# Patient Record
Sex: Female | Born: 1995 | Race: Black or African American | Hispanic: No | Marital: Single | Smoking: Never smoker
Health system: Southern US, Community
[De-identification: ages and names within clinical notes are randomized; demographics above are authoritative.]

## PROBLEM LIST (undated history)

## (undated) DIAGNOSIS — I10 Essential (primary) hypertension: Secondary | ICD-10-CM

## (undated) DIAGNOSIS — E559 Vitamin D deficiency, unspecified: Secondary | ICD-10-CM

## (undated) HISTORY — PX: NO PAST SURGERIES: SHX2092

## (undated) HISTORY — DX: Essential (primary) hypertension: I10

## (undated) HISTORY — DX: Vitamin D deficiency, unspecified: E55.9

---
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
## 2014-07-10 ENCOUNTER — Encounter (HOSPITAL_COMMUNITY): Payer: Self-pay | Admitting: Emergency Medicine

## 2014-07-10 ENCOUNTER — Emergency Department (HOSPITAL_COMMUNITY)
Admission: EM | Admit: 2014-07-10 | Discharge: 2014-07-10 | Disposition: A | Discharge status: Home / Self Care | Payer: No Typology Code available for payment source | Attending: Emergency Medicine | Admitting: Emergency Medicine

## 2014-07-10 DIAGNOSIS — I10 Essential (primary) hypertension: Secondary | ICD-10-CM | POA: Insufficient documentation

## 2014-07-10 DIAGNOSIS — M25569 Pain in unspecified knee: Secondary | ICD-10-CM

## 2014-07-10 DIAGNOSIS — M79605 Pain in left leg: Secondary | ICD-10-CM

## 2014-07-10 DIAGNOSIS — R000 Tachycardia, unspecified: Secondary | ICD-10-CM | POA: Insufficient documentation

## 2014-07-10 MED ORDER — METOPROLOL SUCCINATE ER 50 MG PO TB24
50.0000 mg | ORAL_TABLET | Freq: Every day | ORAL | Status: DC
  Administered 2014-07-10: 50 mg via ORAL
  Filled 2014-07-10: qty 1

## 2014-07-10 MED ORDER — OXYCODONE HCL 5 MG PO TABS
5.0000 mg | ORAL_TABLET | Freq: Once | ORAL | Status: AC
  Administered 2014-07-10: 5 mg via ORAL
  Filled 2014-07-10: qty 1

## 2014-07-10 MED ORDER — OXYCODONE-ACETAMINOPHEN 5-325 MG PO TABS
2.0000 | ORAL_TABLET | Freq: Once | ORAL | Status: AC
  Administered 2014-07-10: 2 via ORAL
  Filled 2014-07-10: qty 2

## 2014-07-10 MED ORDER — METOPROLOL SUCCINATE ER 25 MG PO TB24: 50.0000 mg | ORAL_TABLET | Freq: Every day | ORAL | Status: DC

## 2014-07-10 MED ORDER — DIAZEPAM 5 MG PO TABS: 5.0000 mg | ORAL_TABLET | Freq: Four times a day (QID) | ORAL | Status: DC | PRN

## 2014-07-10 MED ORDER — OXYCODONE-ACETAMINOPHEN 5-325 MG PO TABS
1.0000 | ORAL_TABLET | ORAL | Status: DC | PRN
Start: 2014-07-10 — End: 2015-06-30

## 2014-07-10 NOTE — ED Notes (Signed)
PA student at bedside.

## 2014-07-10 NOTE — Discharge Instructions (Signed)
1. Medications: percocet, valium, usual home medications 2. Treatment: rest, drink plenty of fluids, ice and gentle stretching  3. Follow Up: Please followup with your primary doctor in 3 days for discussion of your diagnoses and further evaluation after today's visit; if you do not have a primary care doctor use the resource guide provided to find one; Please return to the ER for worsening pain, shortness of breath, chest pain or other concerning symptoms.   Hypertension Hypertension, commonly called high blood pressure, is when the force of blood pumping through your arteries is too strong. Your arteries are the blood vessels that carry blood from your heart throughout your body. A blood pressure reading consists of a higher number over a lower number, such as 110/72. The higher number (systolic) is the pressure inside your arteries when your heart pumps. The lower number (diastolic) is the pressure inside your arteries when your heart relaxes. Ideally you want your blood pressure below 120/80. Hypertension forces your heart to work harder to pump blood. Your arteries may become narrow or stiff. Having hypertension puts you at risk for heart disease, stroke, and other problems.  RISK FACTORS Some risk factors for high blood pressure are controllable. Others are not.  Risk factors you cannot control include:   Race. You may be at higher risk if you are African American.  Age. Risk increases with age.  Gender. Men are at higher risk than women before age 20 years. After age 20, women are at higher risk than men. Risk factors you can control include:  Not getting enough exercise or physical activity.  Being overweight.  Getting too much fat, sugar, calories, or salt in your diet.  Drinking too much alcohol. SIGNS AND SYMPTOMS Hypertension does not usually cause signs or symptoms. Extremely high blood pressure (hypertensive crisis) may cause headache, anxiety, shortness of breath, and  nosebleed. DIAGNOSIS  To check if you have hypertension, your health care provider will measure your blood pressure while you are seated, with your arm held at the level of your heart. It should be measured at least twice using the same arm. Certain conditions can cause a difference in blood pressure between your right and left arms. A blood pressure reading that is higher than normal on one occasion does not mean that you need treatment. If one blood pressure reading is high, ask your health care provider about having it checked again. TREATMENT  Treating high blood pressure includes making lifestyle changes and possibly taking medicine. Living a healthy lifestyle can help lower high blood pressure. You may need to change some of your habits. Lifestyle changes may include:  Following the DASH diet. This diet is high in fruits, vegetables, and whole grains. It is low in salt, red meat, and added sugars.  Getting at least 2 hours of brisk physical activity every week.  Losing weight if necessary.  Not smoking.  Limiting alcoholic beverages.  Learning ways to reduce stress. If lifestyle changes are not enough to get your blood pressure under control, your health care provider may prescribe medicine. You may need to take more than one. Work closely with your health care provider to understand the risks and benefits. HOME CARE INSTRUCTIONS  Have your blood pressure rechecked as directed by your health care provider.   Take medicines only as directed by your health care provider. Follow the directions carefully. Blood pressure medicines must be taken as prescribed. The medicine does not work as well when you skip doses. Skipping doses  also puts you at risk for problems.   Do not smoke.   Monitor your blood pressure at home as directed by your health care provider. SEEK MEDICAL CARE IF:   You think you are having a reaction to medicines taken.  You have recurrent headaches or feel  dizzy.  You have swelling in your ankles.  You have trouble with your vision. SEEK IMMEDIATE MEDICAL CARE IF:  You develop a severe headache or confusion.  You have unusual weakness, numbness, or feel faint.  You have severe chest or abdominal pain.  You vomit repeatedly.  You have trouble breathing. MAKE SURE YOU:   Understand these instructions.  Will watch your condition.  Will get help right away if you are not doing well or get worse. Document Released: 06/19/2005 Document Revised: 11/03/2013 Document Reviewed: 04/11/2013 [facility] Patient Information 2015 [place], [place]. This information is not intended to replace advice given to you by your health care provider. Make sure you discuss any questions you have with your health care provider.   Arthralgia Your caregiver has diagnosed you as suffering from an arthralgia. Arthralgia means there is pain in a joint. This can come from many reasons including:  Bruising the joint which causes soreness (inflammation) in the joint.  Wear and tear on the joints which occur as we grow older (osteoarthritis).  Overusing the joint.  Various forms of arthritis.  Infections of the joint. Regardless of the cause of pain in your joint, most of these different pains respond to anti-inflammatory drugs and rest. The exception to this is when a joint is infected, and these cases are treated with antibiotics, if it is a bacterial infection. HOME CARE INSTRUCTIONS   Rest the injured area for as long as directed by your caregiver. Then slowly start using the joint as directed by your caregiver and as the pain allows. Crutches as directed may be useful if the ankles, knees or hips are involved. If the knee was splinted or casted, continue use and care as directed. If an stretchy or elastic wrapping bandage has been applied today, it should be removed and re-applied every 3 to 4 hours. It should not be applied tightly, but firmly enough to keep  swelling down. Watch toes and feet for swelling, bluish discoloration, coldness, numbness or excessive pain. If any of these problems (symptoms) occur, remove the ace bandage and re-apply more loosely. If these symptoms persist, contact your caregiver or return to this location.  For the first 24 hours, keep the injured extremity elevated on pillows while lying down.  Apply ice for 15-20 minutes to the sore joint every couple hours while awake for the first half day. Then 03-04 times per day for the first 48 hours. Put the ice in a plastic bag and place a towel between the bag of ice and your skin.  Wear any splinting, casting, elastic bandage applications, or slings as instructed.  Only take over-the-counter or prescription medicines for pain, discomfort, or fever as directed by your caregiver. Do not use aspirin immediately after the injury unless instructed by your physician. Aspirin can cause increased bleeding and bruising of the tissues.  If you were given crutches, continue to use them as instructed and do not resume weight bearing on the sore joint until instructed. Persistent pain and inability to use the sore joint as directed for more than 2 to 3 days are warning signs indicating that you should see a caregiver for a follow-up visit as soon as possible.  Initially, a hairline fracture (break in bone) may not be evident on X-rays. Persistent pain and swelling indicate that further evaluation, non-weight bearing or use of the joint (use of crutches or slings as instructed), or further X-rays are indicated. X-rays may sometimes not show a small fracture until a week or 10 days later. Make a follow-up appointment with your own caregiver or one to whom we have referred you. A radiologist (specialist in reading X-rays) may read your X-rays. Make sure you know how you are to obtain your X-ray results. Do not assume everything is normal if you do not hear from [place]. SEEK MEDICAL CARE IF: Bruising,  swelling, or pain increases. SEEK IMMEDIATE MEDICAL CARE IF:   Your fingers or toes are numb or blue.  The pain is not responding to medications and continues to stay the same or get worse.  The pain in your joint becomes severe.  You develop a fever over 102 F (38.9 C).  It becomes impossible to move or use the joint. MAKE SURE YOU:   Understand these instructions.  Will watch your condition.  Will get help right away if you are not doing well or get worse. Document Released: 06/19/2005 Document Revised: 09/11/2011 Document Reviewed: 02/05/2008 [facility] Patient Information 2015 [place], [place]. This information is not intended to replace advice given to you by your health care provider. Make sure you discuss any questions you have with your health care provider.

## 2014-07-10 NOTE — Progress Notes (Signed)
Left lower extremity venous duplex completed.  Left:  No evidence of DVT, superficial thrombosis, or Baker's cyst.  Right:  Negative for DVT in the common femoral vein.  

## 2014-07-10 NOTE — ED Notes (Addendum)
Pt reports L posterior thigh pain that began a week ago. Pain has not moved in location. Changing position sometimes helps. Pt ambulatory. No knee pain. No sob or calf pain.

## 2014-07-10 NOTE — ED Provider Notes (Signed)
CSN: [id number]     Arrival date & time 07/10/14  1706 History   First MD Initiated Contact with Patient 07/10/14 1718     Chief Complaint  Patient presents with  . Leg Pain     (Consider location/radiation/quality/duration/timing/severity/associated sxs/prior Treatment) Patient is a 19 y.o. female presenting with leg pain. The history is provided by the patient and medical records. No language interpreter was used.  Leg Pain Associated symptoms: no back pain, no fatigue and no fever      Claudia Delacruz is a 19 y.o. female  with no major medical history presents to the Emergency Department complaining of gradual, persistent, progressively worsening left thigh pain onset approximately 1 week ago. Patient reports the pain began while she was walking. 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  Patient reports the pain is localized to the posterior thigh and does not radiate. She denies knee or hip pain. She denies chest pain, shortness of breath, dyspnea, cough, hemoptysis. She denies recent travel, swelling of one leg, history of DVT, estrogen usage, recent immobilization surgery or broken bones.  Patient reports this has not stopped her from her usual activities. She reports no major medical history however admits that she has not seen a physician in some time.  Patient is without associated symptoms. No treatments prior to arrival. Walking aggravates his symptoms and nothing alleviates them. Patient denies fever, chills, headache, neck pain, chest pain, shortness of breath, abdominal pain, nausea, vomiting, diarrhea, weakness, dizziness, syncope, dysuria.  History reviewed. No pertinent past medical history. History reviewed. No pertinent past surgical history. History reviewed. No pertinent family history. History  Substance Use Topics  . Smoking status: Never Smoker   . Smokeless tobacco: Not on file  . Alcohol Use: No   OB History    No data  available     Review of Systems  Constitutional: Negative for fever, diaphoresis, appetite change, fatigue and unexpected weight change.  HENT: Negative for mouth sores.   Eyes: Negative for visual disturbance.  Respiratory: Negative for cough, chest tightness, shortness of breath and wheezing.   Cardiovascular: Negative for chest pain.  Gastrointestinal: Negative for nausea, vomiting, abdominal pain, diarrhea and constipation.  Endocrine: Negative for polydipsia, polyphagia and polyuria.  Genitourinary: Negative for dysuria, urgency, frequency and hematuria.  Musculoskeletal: Positive for myalgias. Negative for back pain and neck stiffness.  Skin: Negative for rash.  Allergic/Immunologic: Negative for immunocompromised state.  Neurological: Negative for syncope, light-headedness and headaches.  Hematological: Does not bruise/bleed easily.  Psychiatric/Behavioral: Negative for sleep disturbance. The patient is not nervous/anxious.       Allergies  Review of patient's allergies indicates no known allergies.  Home Medications   Prior to Admission medications   Medication Sig Start Date End Date Taking? Authorizing Provider  acetaminophen (TYLENOL) 500 MG tablet Take 1,000 mg by mouth every 6 (six) hours as needed for mild pain or headache.   Yes Historical Provider, MD  ibuprofen (ADVIL,MOTRIN) 200 MG tablet Take 600 mg by mouth every 6 (six) hours as needed for fever or moderate pain.   Yes Historical Provider, MD  diazepam (VALIUM) 5 MG tablet Take 1 tablet (5 mg total) by mouth every 6 (six) hours as needed for anxiety (spasms). 07/10/14   Mariette Cowley, PA-C  metoprolol succinate (TOPROL-XL) 25 MG 24 hr tablet Take 2 tablets (50 mg total) by mouth daily. 07/10/14   Chelesa Weingartner, PA-C  oxyCODONE-acetaminophen (PERCOCET/ROXICET) 5-325 MG per tablet  Take 1-2 tablets by mouth every 4 (four) hours as needed for moderate pain or severe pain. 07/10/14   Raziel Koenigs, PA-C    BP 160/100 mmHg  Pulse 116  Temp(Src) 98 F (36.7 C) (Oral)  Resp 14  SpO2 100% Physical Exam  Constitutional: She appears well-developed and well-nourished. No distress.  Awake, alert, nontoxic appearance Patient in no apparent distress.  Easily hops off the bed and walks around the room without fatigue, dyspnea or chest pain  HENT:  Head: Normocephalic and atraumatic.  Mouth/Throat: Oropharynx is clear and moist. No oropharyngeal exudate.  Eyes: Conjunctivae are normal. No scleral icterus.  Neck: Normal range of motion. Neck supple.  Full ROM without pain  Cardiovascular: Regular rhythm, normal heart sounds and intact distal pulses.   No murmur heard. Tachycardia  Pulmonary/Chest: Effort normal and breath sounds normal. No respiratory distress. She has no wheezes.  Equal chest expansion No Dyspnea Clear and equal breath sounds  Abdominal: Soft. Bowel sounds are normal. She exhibits no distension and no mass. There is no tenderness. There is no rebound and no guarding.  Musculoskeletal: Normal range of motion. She exhibits tenderness. She exhibits no edema.  Full range of motion of the T-spine and L-spine No tenderness to palpation of the spinous processes of the T-spine or L-spine No tenderness to palpation of the paraspinous muscles of the L-spine Mild tenderness to palpation of the left posterior thigh without swelling, erythema, lesions or palpable cord No peripheral edema No calf pain, neg Homan's sign  Lymphadenopathy:    She has no cervical adenopathy.  Neurological: She is alert. She has normal reflexes. She exhibits normal muscle tone. Coordination normal.  Reflex Scores:      Bicep reflexes are 2+ on the right side and 2+ on the left side.      Brachioradialis reflexes are 2+ on the right side and 2+ on the left side.      Patellar reflexes are 2+ on the right side and 2+ on the left side.      Achilles reflexes are 2+ on the right side and 2+ on the left  side. Speech is clear and goal oriented, follows commands Normal 5/5 strength in upper and lower extremities bilaterally including dorsiflexion and plantar flexion, strong and equal grip strength Sensation normal to light and sharp touch Moves extremities without ataxia, coordination intact Normal gait Normal balance No Clonus   Skin: Skin is warm and dry. No rash noted. She is not diaphoretic. No erythema.  Psychiatric: She has a normal mood and affect. Her behavior is normal.  Nursing note and vitals reviewed.   ED Course  Procedures (including critical care time) Labs Review Labs Reviewed - No data to display  Imaging Review No results found.   EKG Interpretation   Date/Time:  Friday July 10 2014 18:30:33 EST Ventricular Rate:  115 PR Interval:  142 QRS Duration: 81 QT Interval:  331 QTC Calculation: 458 R Axis:   74 Text Interpretation:  Sinus tachycardia Baseline wander in lead(s) II III  aVL aVF V1 V3 V4 V5 V6 Confirmed by COOK  MD, BRIAN ([id number]) on 07/10/2014  6:59:17 PM      MDM   Final diagnoses:  Leg pain, posterior, left  Tachycardia  Essential hypertension   Claudia Delacruz presents with tachycardia, hypertension and left posterior thigh pain. Concern for possible DVT versus hamstring strain. Patient is significantly overweight and appears deconditioned. She denies risk factors for PE/DVT including no broken bones,  surgery, history of same, exogenous estrogen or immobilization.  Patient reports no chest pain, shortness of breath or dyspnea on exertion. She exhibits no dyspnea on exertion with movement around the room. She ambulates with a steady gait and without difficulty. She reports she's not seen a doctor in approximately one year but has no history of tachycardia or hypertension. Will obtain venous duplex and reassess. We'll also treat pain and reassess vitals.  6:30PM Left lower extremity venous duplex completed. Left: No evidence of DVT,  superficial thrombosis, or Baker's cyst. Right: Negative for DVT in the common femoral vein.  Pt remains tachycardic and hypertensive. Will obtain EKG to ensure no A. fib however clinical exam with regular rate and rhythm.  7:00PM EKG with sinus tachycardia.  Patient's pain continues to persist. Will give further pain control.  8:30PM Patient with persistent tachycardia and hypertension including manual blood pressure of 160/100 and tachycardia to 116.  I had along discussion with the patient about my concerns about her tachycardia and hypertension. I offered admission for evaluation of this but patient declines and she reports that she feels fine and wants to go home. [place] [employer]  She has no primary care physician. Recommended that she see a cardiologist within 1 week [place].  Patient will be started on metoprolol 50 mg daily for tachycardia and blood pressure management. She continues to deny dyspnea, chest pain or shortness of breath. Patient is without signs or symptoms of coronary embolism and patient has no DVT.  Doubt pulmonary embolism at this time. Patient given strict instructions to return to emergency department for initial onset of chest pain, shortness of breath or other concerns.  BP 160/100 mmHg  Pulse 116  Temp(Src) 98 F (36.7 C) (Oral)  Resp 14  SpO2 100%  The patient was discussed with and seen by Dr. Adriana Simasook who agrees with the treatment plan.   Claudia ClientHannah Daiel Strohecker, PA-C 07/10/14 2137  Donnetta HutchingBrian Cook, MD 07/11/14 1101

## 2015-05-29 ENCOUNTER — Emergency Department (HOSPITAL_COMMUNITY)
Admission: EM | Admit: 2015-05-29 | Discharge: 2015-05-29 | Disposition: A | Discharge status: Home / Self Care | Payer: Medicaid Other | Attending: Emergency Medicine | Admitting: Emergency Medicine

## 2015-05-29 ENCOUNTER — Encounter (HOSPITAL_COMMUNITY): Payer: Self-pay | Admitting: Emergency Medicine

## 2015-05-29 DIAGNOSIS — M792 Neuralgia and neuritis, unspecified: Secondary | ICD-10-CM

## 2015-05-29 DIAGNOSIS — M79671 Pain in right foot: Secondary | ICD-10-CM | POA: Diagnosis present

## 2015-05-29 MED ORDER — HYDROCODONE-ACETAMINOPHEN 5-325 MG PO TABS: 2.0000 | ORAL_TABLET | ORAL | Status: DC | PRN

## 2015-05-29 NOTE — ED Provider Notes (Signed)
CSN: [id number]     Arrival date & time 05/29/15  1020 History   First MD Initiated Contact with Patient 05/29/15 1042     Chief Complaint  Patient presents with  . Foot Pain     (Consider location/radiation/quality/duration/timing/severity/associated sxs/prior Treatment) HPI   Claudia Delacruz is a 19 y.o F with no significant pmhx who presents to the Emergency Department today c/o burning sensation in her R foot. Pt states that this sensation is located on the dorsal aspect of her R foot and has been present for 2 weeks. Pain is worse at night. No aggravating or alleviating factors. Patient has been seen in the emergency department at [facility] twice a last 2 weeks for this pain with no attributed diagnosis. Pain has not improved with ibuprofen. Patient states that she cannot sleep at night due to the burning sensation in her right foot. No history of diabetes. Denies discoloration, swelling, fever, dizziness, trauma or injury, decreased sensation.  History reviewed. No pertinent past medical history. No past surgical history on file. No family history on file. Social History  Substance Use Topics  . Smoking status: Never Smoker   . Smokeless tobacco: None  . Alcohol Use: No   OB History    No data available     Review of Systems  All other systems reviewed and are negative.     Allergies  Review of patient's allergies indicates no known allergies.  Home Medications   Prior to Admission medications   Medication Sig Start Date End Date Taking? Authorizing Provider  acetaminophen (TYLENOL) 650 MG CR tablet Take 1,950 mg by mouth once.   Yes Historical Provider, MD  diazepam (VALIUM) 5 MG tablet Take 1 tablet (5 mg total) by mouth every 6 (six) hours as needed for anxiety (spasms). Patient not taking: Reported on 05/29/2015 07/10/14   Dahlia Client Muthersbaugh, PA-C  HYDROcodone-acetaminophen (NORCO/VICODIN) 5-325 MG tablet Take 2 tablets by mouth every 4 (four) hours as needed.  05/29/15   Mckala Pantaleon Tripp Lira Stephen, PA-C  metoprolol succinate (TOPROL-XL) 25 MG 24 hr tablet Take 2 tablets (50 mg total) by mouth daily. Patient not taking: Reported on 05/29/2015 07/10/14   Dahlia Client Muthersbaugh, PA-C  oxyCODONE-acetaminophen (PERCOCET/ROXICET) 5-325 MG per tablet Take 1-2 tablets by mouth every 4 (four) hours as needed for moderate pain or severe pain. Patient not taking: Reported on 05/29/2015 07/10/14   Dahlia Client Muthersbaugh, PA-C   BP 155/93 mmHg  Pulse 100  Temp(Src) 98.2 F (36.8 C) (Oral)  Resp 18  SpO2 100% Physical Exam  Constitutional: She is oriented to person, place, and time. She appears well-developed and well-nourished. No distress.  HENT:  Head: Normocephalic and atraumatic.  Eyes: Conjunctivae are normal. Right eye exhibits no discharge. Left eye exhibits no discharge. No scleral icterus.  Cardiovascular: Normal rate.   Pulmonary/Chest: Effort normal.  Musculoskeletal: Normal range of motion. She exhibits no edema.  Neurological: She is alert and oriented to person, place, and time. No cranial nerve deficit. Coordination normal.  Strength 5/5 throughout. No sensory deficits.  No gait abnormality. Burning sensation intensified with even mild touch of dorsal aspect of right foot. No discoloration or edema. Normal DTRs. No skin abnormality.  Skin: Skin is warm and dry. No rash noted. She is not diaphoretic. No erythema. No pallor.  Psychiatric: She has a normal mood and affect. Her behavior is normal.  Nursing note and vitals reviewed.   ED Course  Procedures (including critical care time) Labs Review Labs Reviewed - No  data to display  Imaging Review No results found. I have personally reviewed and evaluated these images and lab results as part of my medical decision-making.   EKG Interpretation None      MDM   Final diagnoses:  Neuropathic pain of foot, right   Otherwise healthy 19 year old female presents with 2 week history of burning  sensation in her right foot. Patient has been seen and evaluated at [facility] emergency department twice for this issue with the diagnosis of neuropathic pain. Patient was given ibuprofen at that time which has not relieved her pain. Patient received x-rays of her foot at these visits which were negative for fracture. Will not repeat today. Patient also had her glucose checked at multiple visits which have all been normal. Patient does not have a history of diabetes. Patient's mother states that she attempted to get an appointment at the orthopedic urgent care on [address] to establish primary care however they state that they need a referral as the patient's form of insurance is Medicaid. Will provide patient with this referral today. Unknown etiology of neuropathic pain. On clinical exam the burning sensation is intensified with even light touch of the dorsal aspect of right foot. Will give short course of pain medication until patient can be seen by her primary care provider. Patient is nontoxic appearing, in no apparent distress. Patient able to ambulate without difficulty. Discussed treatment plan with patient and patient's mother who are agreeable. Return precautions outlined in patient discharge instructions.       Lester KinsmanSamantha Tripp [place], PA-C 05/29/15 1150  Mancel BaleElliott Wentz, MD 05/29/15 1539

## 2015-05-29 NOTE — ED Notes (Signed)
Pt states that she has been having rt foot pain x 2 weeks.  [facility] and prescribed with a medication that she cannot remember.  Was told it was nerve damage.  Was told to stop taking it because it was affecting her blood pressure.  States her pain is worse.

## 2015-05-29 NOTE — Discharge Instructions (Signed)
Peripheral Neuropathy °Peripheral neuropathy is a type of nerve damage. It affects nerves that carry signals between the spinal cord and other parts of the body. These are called peripheral nerves. With peripheral neuropathy, one nerve or a group of nerves may be damaged.  °CAUSES  °Many things can damage peripheral nerves. For some people with peripheral neuropathy, the cause is unknown. Some causes include: °· Diabetes. This is the most common cause of peripheral neuropathy. °· Injury to a nerve. °· Pressure or stress on a nerve that lasts a long time. °· Too little vitamin B. Alcoholism can lead to this. °· Infections. °· Autoimmune diseases, such as multiple sclerosis and systemic lupus erythematosus. °· Inherited nerve diseases. °· Some medicines, such as cancer drugs. °· Toxic substances, such as lead and mercury. °· Too little blood flowing to the legs. °· Kidney disease. °· Thyroid disease. °SIGNS AND SYMPTOMS  °Different people have different symptoms. The symptoms you have will depend on which of your nerves is damaged.  Common symptoms include: °· Loss of feeling (numbness) in the feet and hands. °· Tingling in the feet and hands. °· Pain that burns. °· Very sensitive skin. °· Weakness. °· Not being able to move a part of the body (paralysis). °· Muscle twitching. °· Clumsiness or poor coordination. °· Loss of balance. °· Not being able to control your bladder. °· Feeling dizzy. °· Sexual problems. °DIAGNOSIS  °Peripheral neuropathy is a symptom, not a disease. Finding the cause of peripheral neuropathy can be hard. To figure that out, your health care provider will take a medical history and do a physical exam. A neurological exam will also be done. This involves checking things affected by your brain, spinal cord, and nerves (nervous system). For example, your health care provider will check your reflexes, how you move, and what you can feel.  °Other types of tests may also be ordered, such as: °· Blood  tests. °· A test of the fluid in your spinal cord. °· Imaging tests, such as CT scans or an MRI. °· Electromyography (EMG). This test checks the nerves that control muscles. °· Nerve conduction velocity tests. These tests check how fast messages pass through your nerves. °· Nerve biopsy. A small piece of nerve is removed. It is then checked under a microscope. °TREATMENT  °· Medicine is often used to treat peripheral neuropathy. Medicines may include: °¨ Pain-relieving medicines. Prescription or over-the-counter medicine may be suggested. °¨ Antiseizure medicine. This may be used for pain. °¨ Antidepressants. These also may help ease pain from neuropathy. °¨ Lidocaine. This is a numbing medicine. You might wear a patch or be given a shot. °¨ Mexiletine. This medicine is typically used to help control irregular heart rhythms. °· Surgery. Surgery may be needed to relieve pressure on a nerve or to destroy a nerve that is causing pain. °· Physical therapy to help movement. °· Assistive devices to help movement. °HOME CARE INSTRUCTIONS  °· Only take over-the-counter or prescription medicines as directed by your health care provider. Follow the instructions carefully for any given medicines. Do not take any other medicines without first getting approval from your health care provider. °· If you have diabetes, work closely with your health care provider to keep your blood sugar under control. °· If you have numbness in your feet: °¨ Check every day for signs of injury or infection. Watch for redness, warmth, and swelling. °¨ Wear padded socks and comfortable shoes. These help protect your feet. °· Do not do   things that put pressure on your damaged nerve.  Do not smoke. Smoking keeps blood from getting to damaged nerves.  Avoid or limit alcohol. Too much alcohol can cause a lack of B vitamins. These vitamins are needed for healthy nerves.  Develop a good support system. Coping with peripheral neuropathy can be  stressful. Talk to a mental health specialist or join a support group if you are struggling.  Follow up with your health care provider as directed. SEEK MEDICAL CARE IF:   You have new signs or symptoms of peripheral neuropathy.  You are struggling emotionally from dealing with peripheral neuropathy.  You have a fever. SEEK IMMEDIATE MEDICAL CARE IF:   You have an injury or infection that is not healing.  You feel very dizzy or begin vomiting.  You have chest pain.  You have trouble breathing.   This information is not intended to replace advice given to you by your health care provider. Make sure you discuss any questions you have with your health care provider.  Follow up with Urgent Care to establish primary care and for follow up evaluation of neuropathic pain. Return to the Emergency Department if you experience worsening of your symptoms, redness or swelling of foot, loss of sensation in foot, dizziness, loss of consciousness, or difficulty breathing.

## 2015-05-30 ENCOUNTER — Emergency Department (HOSPITAL_COMMUNITY): Admission: EM | Admit: 2015-05-30 | Discharge: 2015-05-30 | Discharge status: Absent without leave | Payer: Self-pay

## 2015-06-01 ENCOUNTER — Ambulatory Visit: Payer: Medicaid Other | Attending: Family Medicine | Admitting: Physician Assistant

## 2015-06-01 VITALS — BP 158/91 | HR 106 | Temp 98.6°F | Resp 18 | Ht 71.0 in | Wt 340.0 lb

## 2015-06-01 DIAGNOSIS — M79642 Pain in left hand: Secondary | ICD-10-CM

## 2015-06-01 DIAGNOSIS — M79671 Pain in right foot: Secondary | ICD-10-CM | POA: Diagnosis not present

## 2015-06-01 DIAGNOSIS — Z79899 Other long term (current) drug therapy: Secondary | ICD-10-CM | POA: Diagnosis not present

## 2015-06-01 LAB — COMPLETE METABOLIC PANEL WITH GFR
ALT: 21 U/L (ref 6–29)
AST: 25 U/L (ref 10–30)
Albumin: 3.9 g/dL (ref 3.6–5.1)
Alkaline Phosphatase: 72 U/L (ref 33–115)
BUN: 9 mg/dL (ref 7–25)
CHLORIDE: 103 mmol/L (ref 98–110)
CO2: 25 mmol/L (ref 20–31)
Calcium: 9.4 mg/dL (ref 8.6–10.2)
Creat: 0.64 mg/dL (ref 0.50–1.10)
GFR, Est Non African American: >89 mL/min (ref >=60)
Glucose, Bld: 99 mg/dL (ref 65–99)
POTASSIUM: 4.2 mmol/L (ref 3.5–5.3)
Sodium: 139 mmol/L (ref 135–146)
Total Bilirubin: 0.3 mg/dL (ref 0.2–1.2)
Total Protein: 7.5 g/dL (ref 6.1–8.1)

## 2015-06-01 LAB — RHEUMATOID FACTOR

## 2015-06-01 LAB — TSH: TSH: 1.491 u[IU]/mL (ref 0.350–4.500)

## 2015-06-01 MED ORDER — PREDNISONE 20 MG PO TABS: 20.0000 mg | ORAL_TABLET | Freq: Every day | ORAL | Status: DC

## 2015-06-01 NOTE — Progress Notes (Signed)
ED f/up neuropathic pain on right foot. Pt rates pain at level 8/10. Describes pain as shooting, burning pain.   Pt c/o left hand pain. Pt reports that it's hard to open her hand. Rates pain 6/10. Describes pain as a burning sensation x3-4 days.

## 2015-06-01 NOTE — Progress Notes (Signed)
Claudia PerlaKrystal Dirk  [id number]  [id number]  DOB - 1995-12-04  Chief Complaint  Patient presents with  . Follow-up       Subjective:   Claudia Delacruz is a 19 y.o. female here today for establishment of care. She presented to the emergency department on 05/29/2015 with right foot pain. She described a burning sensation to the top of her right foot that is been going on for at least 2 weeks. Is worse at night. She had been to the emergency department twice prior at [facility]. She was diagnosed with neuropathy. She's been given anti-inflammatories with temporary relief. Her x-rays and laboratory data have been within normal limits. This admission she was given a prescription for Norco and was told to come here to receive an orthopedic referral. She also states that this gives her temporary relief.  ROS: GEN: denies fever or chills, denies change in weight Skin: denies lesions or rashes EXT: denies muscle spasms or swelling; no pain in lower ext, + weakness in left hand NEURO: + numbness or tingling, denies sz, stroke or TIA  Problem  Right Foot Pain  Left Hand Pain    ALLERGIES: No Known Allergies  PAST MEDICAL HISTORY: History reviewed. No pertinent past medical history.  PAST SURGICAL HISTORY: History reviewed. No pertinent past surgical history.  MEDICATIONS AT HOME: Prior to Admission medications   Medication Sig Start Date End Date Taking? Authorizing Provider  acetaminophen (TYLENOL) 650 MG CR tablet Take 1,950 mg by mouth once.   Yes Historical Provider, MD  diazepam (VALIUM) 5 MG tablet Take 1 tablet (5 mg total) by mouth every 6 (six) hours as needed for anxiety (spasms). Patient not taking: Reported on 05/29/2015 07/10/14   Dahlia ClientHannah Muthersbaugh, PA-C  HYDROcodone-acetaminophen (NORCO/VICODIN) 5-325 MG tablet Take 2 tablets by mouth every 4 (four) hours as needed. Patient not taking: Reported on 06/01/2015 05/29/15   Samantha Tripp Dowless, PA-C  metoprolol  succinate (TOPROL-XL) 25 MG 24 hr tablet Take 2 tablets (50 mg total) by mouth daily. Patient not taking: Reported on 05/29/2015 07/10/14   Dahlia ClientHannah Muthersbaugh, PA-C  oxyCODONE-acetaminophen (PERCOCET/ROXICET) 5-325 MG per tablet Take 1-2 tablets by mouth every 4 (four) hours as needed for moderate pain or severe pain. Patient not taking: Reported on 05/29/2015 07/10/14   Dahlia ClientHannah Muthersbaugh, PA-C  predniSONE (DELTASONE) 20 MG tablet Take 1 tablet (20 mg total) by mouth daily with breakfast. 06/01/15   Vivianne Masteriffany S Dacen Frayre, PA-C     Objective:   Filed Vitals:   06/01/15 0938  BP: 158/91  Pulse: 106  Temp: 98.6 F (37 C)  TempSrc: Oral  Resp: 18  Height: 5\' 11"  (1.803 m)  Weight: 340 lb (154.223 kg)  SpO2: 99%    Exam General appearance : Awake, alert, not in any distress. Speech Clear. Not toxic looking. Overweight.  HEENT: Atraumatic and Normocephalic, pupils equally reactive to light and accomodation. Extremities: B/L Lower Ext shows no edema, both legs are warm to touch; no decrease in ROM or obvious deficits Neurology: Awake alert, and oriented X 3, CN II-XII intact, Non focal   Assessment & Plan  1. Neuropathic pain of right foot and left hand  -check ANA, sed rate, RF, CBC, CMP  -Course of steroids  -Cont pain meds as needed > no refill today  -Ortho referral    Return in about 4 weeks (around 06/29/2015).  The patient was given clear instructions to go to ER or return to medical center if symptoms don't improve, worsen or  new problems develop. The patient verbalized understanding. The patient was told to call to get lab results if they haven't heard anything in the next week.   This note has been created with Education officer, environmental. Any transcriptional errors are unintentional.    Scot Jun, PA-C [facility] [place], [place] [phone]   06/01/2015, 10:30 AM

## 2015-06-02 LAB — CBC WITH DIFFERENTIAL/PLATELET
BASOS ABS: 0 10*3/uL (ref 0.0–0.1)
Basophils Relative: 0 % (ref 0–1)
EOS ABS: 0 10*3/uL (ref 0.0–0.7)
EOS PCT: 0 % (ref 0–5)
HEMATOCRIT: 31.6 % — AB (ref 36.0–46.0)
Hemoglobin: 10.1 g/dL — ABNORMAL LOW (ref 12.0–15.0)
LYMPHS ABS: 3.3 10*3/uL (ref 0.7–4.0)
LYMPHS PCT: 34 % (ref 12–46)
MCH: 17.5 pg — AB (ref 26.0–34.0)
MCHC: 32 g/dL (ref 30.0–36.0)
MCV: 54.8 fL — AB (ref 78.0–100.0)
MONOS PCT: 8 % (ref 3–12)
Monocytes Absolute: 0.8 10*3/uL (ref 0.1–1.0)
Neutro Abs: 5.6 10*3/uL (ref 1.7–7.7)
Neutrophils Relative %: 58 % (ref 43–77)
PLATELETS: 458 10*3/uL — AB (ref 150–400)
RBC: 5.77 MIL/uL — ABNORMAL HIGH (ref 3.87–5.11)
RDW: 23.7 % — ABNORMAL HIGH (ref 11.5–15.5)
WBC: 9.7 10*3/uL (ref 4.0–10.5)

## 2015-06-02 LAB — SEDIMENTATION RATE: SED RATE: 17 mm/h (ref 0–20)

## 2015-06-02 LAB — ANA: Anti Nuclear Antibody(ANA): NEGATIVE

## 2015-06-21 ENCOUNTER — Ambulatory Visit (INDEPENDENT_AMBULATORY_CARE_PROVIDER_SITE_OTHER): Payer: Medicaid Other | Admitting: Sports Medicine

## 2015-06-21 ENCOUNTER — Encounter: Payer: Self-pay | Admitting: Sports Medicine

## 2015-06-21 VITALS — BP 160/98 | Ht 71.0 in | Wt 340.0 lb

## 2015-06-21 DIAGNOSIS — M25562 Pain in left knee: Secondary | ICD-10-CM

## 2015-06-21 DIAGNOSIS — M25561 Pain in right knee: Secondary | ICD-10-CM | POA: Diagnosis not present

## 2015-06-21 DIAGNOSIS — M79671 Pain in right foot: Secondary | ICD-10-CM | POA: Diagnosis not present

## 2015-06-21 MED ORDER — GABAPENTIN 300 MG PO CAPS: 300.0000 mg | ORAL_CAPSULE | Freq: Every day | ORAL | Status: DC

## 2015-06-21 NOTE — Progress Notes (Signed)
Subjective:     Patient ID: Claudia ButcherKrystal C Delacruz, female   DOB: 12-07-1995, 19 y.o.   MRN: [id number]  HPI Claudia Delacruz is a [place]19yo female presenting today for evaluation of bilateral foot pain. - Told in past her symptoms are due to nerve damage. [place] [facility] and once [facility]. - Symptoms first started in November 2016 - Denies history of trauma or injury - Pain located on dorsal surface of feet bilaterally, right foot worse than left - Describes pain in left foot as burning, while pain in right foot is sharp pins and needles - Denies changes in gait - Notes pain at all times, however significantly worsens at night when lying down - Pain not affected by activity or weight bearing - At nights has to get up and walk to help relieve pain. Feet feel restless at night. - Reports history of xrays of right foot in [facility] which were normal - Has tried a course of prednisone and Tylenol, both of which helped some with pain - In process of establishing with PCP. First visit December 28  Review of Systems Per HPI    Objective:   Physical Exam General: 19yo female resting comfortably in no apparent distress Cardiac: Lower extremities warm and well perfused Resp: No increased work of breathing noted Musc: Feet symmetric bilaterally with no signs of effusion, no tenderness to palpation over feet or ankles noted, ROM of ankles intact bilaterally, muscle strength 5/5 in lower extremities bilaterally, anterior drawer and talar tilt symmetric bilaterally, pes planus noted, splaying of first and second digit bilaterally indicating dropped transverse arch    Assessment and Plan:     # Neuropathic Pain Bilaterally: Differential to include Restless Leg Syndrome, systemic disease such as Diabetes, or due to pes planus or loss of transverse arches noted on exam. Glucose 99 in 06/01/15. Pain worse at night, requiring ambulation and movement to improve. Has completed course of  Prednisone and Tylenol PRN.  Plan: - Gabapentin 300mg  at night - Inserts given with scaphoid and metatarsal pads added - Follow up with PCP on June 30, 2015 as scheduled to complete workup of systemic causes of neuropathy - Follow up in four weeks. Consider custom orthotics if inserts are effective. May also consider titration of Gabapentin up if needed.  Patient seen and evaluated with the resident. I agree with the above plan of care. Etiology of her neuropathic foot pain is unclear. She will establish care with her new PCP on December 18 and follow-up with me in one month. We will try some green sports insoles with scaphoid pads and metatarsal pads to see if that will alleviate some of her symptoms. We will also try her on 300 mg of gabapentin at night.

## 2015-06-30 ENCOUNTER — Encounter: Payer: Self-pay | Admitting: Internal Medicine

## 2015-06-30 ENCOUNTER — Ambulatory Visit: Payer: Medicaid Other | Attending: Internal Medicine | Admitting: Internal Medicine

## 2015-06-30 VITALS — BP 159/99 | HR 108 | Temp 98.3°F | Resp 17 | Ht 71.0 in | Wt 332.4 lb

## 2015-06-30 DIAGNOSIS — G2581 Restless legs syndrome: Secondary | ICD-10-CM | POA: Diagnosis not present

## 2015-06-30 DIAGNOSIS — Z6841 Body Mass Index (BMI) 40.0 and over, adult: Secondary | ICD-10-CM | POA: Insufficient documentation

## 2015-06-30 DIAGNOSIS — D649 Anemia, unspecified: Secondary | ICD-10-CM | POA: Diagnosis not present

## 2015-06-30 DIAGNOSIS — Z8249 Family history of ischemic heart disease and other diseases of the circulatory system: Secondary | ICD-10-CM | POA: Diagnosis not present

## 2015-06-30 DIAGNOSIS — R000 Tachycardia, unspecified: Secondary | ICD-10-CM | POA: Diagnosis not present

## 2015-06-30 DIAGNOSIS — I10 Essential (primary) hypertension: Secondary | ICD-10-CM | POA: Diagnosis not present

## 2015-06-30 DIAGNOSIS — G629 Polyneuropathy, unspecified: Secondary | ICD-10-CM

## 2015-06-30 MED ORDER — FERROUS SULFATE 325 (65 FE) MG PO TABS: 325.0000 mg | ORAL_TABLET | Freq: Two times a day (BID) | ORAL | Status: DC

## 2015-06-30 MED ORDER — METOPROLOL SUCCINATE ER 25 MG PO TB24: 50.0000 mg | ORAL_TABLET | Freq: Every day | ORAL | Status: DC

## 2015-06-30 NOTE — Progress Notes (Signed)
Patient here to establish care Patient complains of having nerve damage to bilateral feet  And currently taking gabapentin

## 2015-06-30 NOTE — Patient Instructions (Signed)
Return to office for a nurse visit to recheck your blood pressure on January 3,2017

## 2015-06-30 NOTE — Progress Notes (Signed)
Patient ID: Claudia Delacruz C Arechiga, female   DOB: [birth year], 19 y.o.   MRN: [id number]  CC: neuropathy  HPI: Claudia Delacruz is a 19 y.o. female here today for a follow up visit.  Patient has past medical history of neuropathy. Patient was seen here last month for complaints of neuropathy and was referred to Orthopedics. She has since been to Orthopedics and has been started on gabapentin 300 mg at bedtime. Patient was told to return to her PCP for further evaluation of neuropathy. Her differentials included restless leg syndrome. She reports that she is due to go back to college in [place] next week and is afraid that she will not be able to find a PCP that can continue to follow up on her pain. Of note patient has been found to have elevated blood pressure on several occasions. She reports that she was once given blood pressure medication but never got the prescription filled. She denies chest pain, SOB, palpitations. She does admit to a family history of obesity and hypertension. She denies any family history of diabetes.  No Known Allergies History reviewed. No pertinent past medical history. Current Outpatient Prescriptions on File Prior to Visit  Medication Sig Dispense Refill  . acetaminophen (TYLENOL) 650 MG CR tablet Take 1,950 mg by mouth once.    . gabapentin (NEURONTIN) 300 MG capsule Take 1 capsule (300 mg total) by mouth at bedtime. 30 capsule 1   No current facility-administered medications on file prior to visit.   History reviewed. No pertinent family history. Social History   Social History  . Marital Status: Single    Spouse Name: N/A  . Number of Children: N/A  . Years of Education: N/A   Occupational History  . Not on file.   Social History Main Topics  . Smoking status: Never Smoker   . Smokeless tobacco: Not on file  . Alcohol Use: No  . Drug Use: No  . Sexual Activity: Not on file   Other Topics Concern  . Not on file   Social History Narrative    Review of  Systems: Other than what is stated in HPI, all other systems are negative.   Objective:   Filed Vitals:   06/30/15 1633  BP: 159/99  Pulse: 108  Temp: 98.3 F (36.8 C)  Resp: 17    Physical Exam  Constitutional: She is oriented to person, place, and time.  Neck: No thyromegaly present.  Cardiovascular: Normal rate, regular rhythm and normal heart sounds.   Pulmonary/Chest: Effort normal.  Musculoskeletal: She exhibits no edema.  Neurological: She is alert and oriented to person, place, and time.     Lab Results  Component Value Date   WBC 9.7 06/01/2015   HGB 10.1* 06/01/2015   HCT 31.6* 06/01/2015   MCV 54.8* 06/01/2015   PLT 458* 06/01/2015   Lab Results  Component Value Date   CREATININE 0.64 06/01/2015   BUN 9 06/01/2015   NA 139 06/01/2015   K 4.2 06/01/2015   CL 103 06/01/2015   CO2 25 06/01/2015    No results found for: HGBA1C Lipid Panel  No results found for: CHOL, TRIG, HDL, CHOLHDL, VLDL, LDLCALC     Assessment and plan:   Grover CanavanKrystal was seen today for establish care.  Diagnoses and all orders for this visit:  Neuropathy (HCC) -     Vitamin D, 25-hydroxy -     Vitamin B12 I will evaluate some test and call with results. I have asked that  she continue gabapentin because it can treat restless leg and neuropathy. She will continue to follow up with Orthopedics.   Essential hypertension -    Begin metoprolol succinate (TOPROL-XL) 25 MG 24 hr tablet; Take 2 tablets (50 mg total) by mouth daily. Patients BP is elevated with some tachycardia. Tachycardia could be from anemia. She will begin medication and come back on 07/05/14 for a BP recheck. Stressed weight loss which may improve her pressures.  Obesity Weight loss discussed at length and its complications to health.  Patient will loss 10 pounds by next visit in 3 months.  Diet and exercise discussed as well as calorie intake.  Anemia, unspecified anemia type -   Begin ferrous sulfate 325 (65 FE) MG  tablet; Take 1 tablet (325 mg total) by mouth 2 (two) times daily with a meal. Will recheck in 1 month or have patient to follow up with new PCP in [place]. I have given her a list of Medicaid providers in [facility].      Return for 12/3 for a BP recheck with RN.   Ambrose Finland, NP-C [facility] [phone] 06/30/2015, 4:40 PM

## 2015-07-01 LAB — VITAMIN B12: Vitamin B-12: 529 pg/mL (ref 211–911)

## 2015-07-01 LAB — VITAMIN D 25 HYDROXY (VIT D DEFICIENCY, FRACTURES): VIT D 25 HYDROXY: 12 ng/mL — AB (ref 30–100)

## 2015-07-06 ENCOUNTER — Encounter: Payer: Medicaid Other | Admitting: Pharmacist

## 2015-07-06 ENCOUNTER — Telehealth: Payer: Self-pay

## 2015-07-06 MED ORDER — VITAMIN D (ERGOCALCIFEROL) 1.25 MG (50000 UNIT) PO CAPS: 50000.0000 [IU] | ORAL_CAPSULE | ORAL | Status: DC

## 2015-07-06 NOTE — Telephone Encounter (Signed)
Spoke with patient this am and she is aware of her lab results and to pick  Up her RX at the pharmacy on file

## 2015-07-06 NOTE — Telephone Encounter (Signed)
-----   Message from Ambrose FinlandValerie A Keck, NP sent at 07/02/2015  2:00 PM EST ----- Vitamin D is low. Please send drisdol 50,000 IU to take once weekly for 12 weeks. 12 tablets no refills. This may help with some of her symptoms and energy level

## 2015-07-26 ENCOUNTER — Ambulatory Visit (INDEPENDENT_AMBULATORY_CARE_PROVIDER_SITE_OTHER): Payer: Medicaid Other | Admitting: Sports Medicine

## 2015-07-26 ENCOUNTER — Encounter: Payer: Self-pay | Admitting: Sports Medicine

## 2015-07-26 VITALS — BP 128/97 | Ht 71.0 in | Wt 326.0 lb

## 2015-07-26 DIAGNOSIS — M79672 Pain in left foot: Secondary | ICD-10-CM

## 2015-07-26 DIAGNOSIS — M2141 Flat foot [pes planus] (acquired), right foot: Secondary | ICD-10-CM | POA: Diagnosis not present

## 2015-07-26 DIAGNOSIS — M79671 Pain in right foot: Secondary | ICD-10-CM | POA: Insufficient documentation

## 2015-07-26 DIAGNOSIS — M2142 Flat foot [pes planus] (acquired), left foot: Secondary | ICD-10-CM | POA: Diagnosis not present

## 2015-07-26 NOTE — Progress Notes (Signed)
Subjective:    Patient ID: Claudia Delacruz, female    DOB: September 13, 1995, 20 y.o.   MRN: [id number]  Claudia Delacruz is a 20 y.o. female presenting on 07/26/2015 for BILATERAL FOOT PAIN  HPI   BILATERAL NEUROPATHIC FOOT PAIN: - Last visit 06/21/15, initial symptom onset 05/2015, see last note for details, briefly there was no inciting injury, trauma or fall. Diagnosed with suspected bilateral neuropathic pain of unclear etiology, thought restless leg syndrome and pes planus could be playing a role, advised further systemic disease testing. Started gabapentin 300 qhs and temporary insoles with scaphoid metatarsal padding. - Interval history, has followed up with PCP at [facility], had more bloodwork including Vitamin D (deficient at lvl 12) and Vitamin B12 (normal), A1c reportedly normal, was advised not diabetes. - Today reports that overall she is improved with reduced bilateral foot pain, describes pain as "burning" with some "tingling" previously was at 10/10 severity now seems to be doing better around 6/10 but varies depending on the day. Admits that her feet don't hurt while ambulating, but pain occurs mostly at rest especially at night, difficulty sleeping. - PMH with morbid obesity, and treated for HTN and Vitamin D deficiency, tested in 06/30/15, treated for 2 weeks for HTN and then taking Vitamin D3 50,000 iu weekly - Taking Gabapentin  at night with some relief, wearing temporary insoles everyday, both help but overall nothing significantly resolving symptoms - Denies any active numbness, tingling or weakness, fevers/chills, other joint or nerve pain, saddle anesthesia, bowel or bladder incontinence  Social History   Social History  . Marital Status: Single    Spouse Name: N/A  . Number of Children: N/A  . Years of Education: N/A   Occupational History  . Not on file.   Social History Main Topics  . Smoking status: Never Smoker   . Smokeless tobacco: Not on file  . Alcohol  Use: No  . Drug Use: No  . Sexual Activity: Not on file   Other Topics Concern  . Not on file   Social History Narrative    Review of Systems Per HPI unless specifically indicated above     Objective:    BP 128/97 mmHg  Ht  (1.803 m)  Wt 326 lb (147.873 kg)  BMI 45.49 kg/m2  Wt Readings from Last 3 Encounters:  07/26/15 326 lb (147.873 kg)  06/30/15 332 lb 6.4 oz (150.776 kg)  06/21/15 340 lb (154.223 kg)    Physical Exam  Constitutional: She appears well-developed and well-nourished. No distress.  Morbidly obese, comfortable, cooperative  Musculoskeletal:  Bilateral Feet Inspection: Normal appearing at rest. Standing with bilateral pes planus (Left more severe than Right) with loss of transverse arch. No edema or erythema. Palpation: Non-tender (ankle, foot, toes).  ROM: Full AROM. Special Testing: Negative Tinel's over posterior tib. Strength: 5/5 ankle dorsiflexion, plantarflexion, great toe flex/ext Neurovascular: Distally intact   Neurological: She is alert.  Patellar DTRs +2 bilateral and Achilles +1 bilateral symmetrical. Distally lower ext intact to light touch bilateral.  Skin: She is not diaphoretic.  Nursing note and vitals reviewed.  Results for orders placed or performed in visit on 06/30/15  Vitamin D, 25-hydroxy  Result Value Ref Range   Vit D, 25-Hydroxy 12 (L) 30 - 100 ng/mL  Vitamin B12  Result Value Ref Range   Vitamin B-12 529 211 - 911 pg/mL      Assessment & Plan:   Problem List Items Addressed This Visit  Bilateral foot pain - Primary    Mildly improved but persistent bilateral neuropathic foot pain of unclear etiology. Symptoms worse at rest and at night, otherwise not limiting activity / ambulation. Some improvement with gabapentin, insoles (for pes planus). Differential Dx: Neuropathic pain with symmetrical distribution (ruled out several systemic causes with DM2, B12 deficiency), concern Restless Leg Syndrome (by history  improves with activity, worse at rest), possible contribution with pes planus.  Plan: 1. Referral to Neurology for further evaluation and management, may pursue testing with nerve conduction studies, or other labs. Order placed today, advised patient to contact PCP for scheduling. 2. Continue Gabapentin  qhs and temporary insoles w/ scaphoid pads, considered dosage change gabapentin, mutual decision to not change until see neurologist for further advice 3. RTC PRN following neurology work-up, would consider custom orthotics for pes planus if needed in future      Relevant Orders   Ambulatory referral to Neurology   Pes planus of both feet       Follow up plan: Return if symptoms worsen or fail to improve, for bilateral flat feet with neuropathic foot pain.  Saralyn Pilar, DO [facility], PGY-3  Patient seen and evaluated with the resident. I agree with the above plan of care. We will start with neurology evaluation. If neurology workup is unremarkable then patient will return to the office for custom orthotics.

## 2015-07-26 NOTE — Assessment & Plan Note (Signed)
Mildly improved but persistent bilateral neuropathic foot pain of unclear etiology. Symptoms worse at rest and at night, otherwise not limiting activity / ambulation. Some improvement with gabapentin, insoles (for pes planus). Differential Dx: Neuropathic pain with symmetrical distribution (ruled out several systemic causes with DM2, B12 deficiency), concern Restless Leg Syndrome (by history improves with activity, worse at rest), possible contribution with pes planus.  Plan: 1. Referral to Neurology for further evaluation and management, may pursue testing with nerve conduction studies, or other labs. Order placed today, advised patient to contact PCP for scheduling. 2. Continue Gabapentin  qhs and temporary insoles w/ scaphoid pads, considered dosage change gabapentin, mutual decision to not change until see neurologist for further advice 3. RTC PRN following neurology work-up, would consider custom orthotics for pes planus if needed in future

## 2015-07-26 NOTE — Patient Instructions (Signed)
Your diagnosis is "Bilateral Neuropathic Foot Pain", we believe that this is caused by nerve irritation given your symptoms.  We have ordered a future referral for Neurologist appointment for further evaluation, testing, and treatment. However, you do need to call your primary care doctor's office and ask them to schedule and arrange this referral.  You may continue Gabapentin  nightly for now. Ask Neurologist about this dose, may need to change it.  Keep wearing green insoles for shoes. In future, after you have completed testing from Neurologist, and no further treatment, you may follow-up with [place] here at [facility] to get custom orthotics for shoes to correct flat feet.

## 2015-07-27 ENCOUNTER — Telehealth: Payer: Self-pay | Admitting: Family Medicine

## 2015-07-27 NOTE — Telephone Encounter (Signed)
Noted routed to [facility], she saw patient last.

## 2015-07-27 NOTE — Telephone Encounter (Signed)
Patient called requesting a referral to neurology, pt states she was referred to ortho and ortho needs PCP to refer pt to neurology. Please f/u

## 2015-08-26 ENCOUNTER — Encounter: Payer: Self-pay | Admitting: Neurology

## 2015-08-26 ENCOUNTER — Ambulatory Visit (INDEPENDENT_AMBULATORY_CARE_PROVIDER_SITE_OTHER): Payer: Medicaid Other | Admitting: Neurology

## 2015-08-26 VITALS — BP 118/80 | HR 102 | Ht 71.0 in | Wt 331.0 lb

## 2015-08-26 DIAGNOSIS — R209 Unspecified disturbances of skin sensation: Secondary | ICD-10-CM | POA: Diagnosis not present

## 2015-08-26 DIAGNOSIS — G609 Hereditary and idiopathic neuropathy, unspecified: Secondary | ICD-10-CM

## 2015-08-26 DIAGNOSIS — IMO0001 Reserved for inherently not codable concepts without codable children: Secondary | ICD-10-CM

## 2015-08-26 MED ORDER — GABAPENTIN 100 MG PO CAPS: 200.0000 mg | ORAL_CAPSULE | Freq: Every day | ORAL | Status: DC

## 2015-08-26 NOTE — Progress Notes (Signed)
[facility] Clinic Note - Initial Visit   Date: 08/26/2015  Claudia Delacruz MRN: [id number] DOB: 1996/02/02   Dear Dr. Adrian Blackwater:  Thank you for your kind referral of Claudia Delacruz for consultation of bilateral feet pain. Although her history is well known to you, please allow [place] to reiterate it for the purpose of our medical record. The patient was accompanied to the clinic by mother who also provides collateral information.     History of Present Illness: Claudia Delacruz is a 20 y.o. right-handed [place] American female with morbid obesity, hypertension, iron deficient anemia, and vitamin D deficiency presenting for evaluation of bilateral feet pain.    In November 2016, she woke up one morning with burning pain over the dorsum of the foot which started in the right foot.  A few weeks later, she began having the same sensation over the left foot. This occurs several times per day, lasting about an hour.  She has noticed that closed shoes makes it worse and improved when barefoot or wearing flipflops.    In January 2017, she began noticing numbness of the lower legs and foot, involving the anterior and posterior portion.  It usually occurs when laying supine and lasts less than 5-minutes.  If she repositions, the numbness quickly resolves. She was started on gabapentin '300mg'$  at bedtime which significantly helped her pain, but she stopped it a month.  She has noticed burning pain and sharp shooting pain has slowly started to return.  She denies any back pain, weakness, imbalance, or falls.  No family history of neuropathy, however paternal family history is unknown.   Patient met all developmental milestones on time.    Out-side paper records, electronic medical record, and images have been reviewed where available and summarized as:   Labs 06/01/2015:  Vitamin B12 529, TSH 1.491, ANA neg, RF neg, ESR 17  Past Medical History  Diagnosis Date  . Vitamin D  deficiency   . Hypertension     Past Surgical History  Procedure Laterality Date  . No past surgeries       Medications:  Outpatient Encounter Prescriptions as of 08/26/2015  Medication Sig Note  . acetaminophen (TYLENOL) 650 MG CR tablet Take 1,950 mg by mouth once.   . ferrous sulfate 325 (65 FE) MG tablet Take 1 tablet (325 mg total) by mouth 2 (two) times daily with a meal.   . metoprolol succinate (TOPROL-XL) 25 MG 24 hr tablet Take 2 tablets (50 mg total) by mouth daily.   . Vitamin D, Ergocalciferol, (DRISDOL) 50000 units CAPS capsule Take 1 capsule (50,000 Units total) by mouth every 7 (seven) days.   . [DISCONTINUED] gabapentin (NEURONTIN) 300 MG capsule Take 1 capsule (300 mg total) by mouth at bedtime.   . [DISCONTINUED] HYDROcodone-acetaminophen (NORCO/VICODIN) 5-325 MG tablet take 2 tablets by mouth every 4 hours if needed 07/26/2015: Received from: External Pharmacy  . [DISCONTINUED] predniSONE (DELTASONE) 20 MG tablet  07/26/2015: Received from: External Pharmacy   No facility-administered encounter medications on file as of 08/26/2015.     Allergies: No Known Allergies  Family History: Family History  Problem Relation Age of Onset  . Healthy Mother   . Anemia Sister   . Neuropathy Neg Hx   . Diabetes Mellitus II Maternal Grandmother   . Heart failure Maternal Grandfather     Social History: Social History  Substance Use Topics  . Smoking status: Never Smoker   . Smokeless tobacco: None  .  Alcohol Use: No   Social History   Social History Narrative   She lives with mother.     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       Review of Systems:  CONSTITUTIONAL: No fevers, chills, night sweats, or weight loss.   EYES: No visual changes or eye pain ENT: No hearing changes.  No history of nose bleeds.   RESPIRATORY: No cough, wheezing and shortness of breath.   CARDIOVASCULAR: Negative for chest pain, and  palpitations.   GI: Negative for abdominal discomfort, blood in stools or black stools.  No recent change in bowel habits.   GU:  No history of incontinence.   MUSCLOSKELETAL: No history of joint pain or swelling.  No myalgias.   SKIN: Negative for lesions, rash, and itching.   HEMATOLOGY/ONCOLOGY: Negative for prolonged bleeding, bruising easily, and swollen nodes.  No history of cancer.   ENDOCRINE: Negative for cold or heat intolerance, polydipsia or goiter.   PSYCH:  No depression or anxiety symptoms.   NEURO: As Above.   Vital Signs:  BP 118/80 mmHg  Pulse 102  Ht 5\' 11"  (1.803 m)  Wt 331 lb (150.141 kg)  BMI 46.19 kg/m2 Pain Scale: 0 on a scale of 0-10   General Medical Exam:   General:  Well appearing, obese, comfortable.   Eyes/ENT: see cranial nerve examination.  Multiple lip and tongue piercings Neck: No masses appreciated.  Full range of motion without tenderness.  No carotid bruits. Respiratory:  Clear to auscultation, good air entry bilaterally.   Cardiac:  Regular rate and rhythm, no murmur.   Extremities:  No deformities, edema, or skin discoloration.  Mild pes planus bilaterally. Skin:  No rashes or lesions.  Neurological Exam: MENTAL STATUS including orientation to time, place, person, recent and remote memory, attention span and concentration, language, and fund of knowledge is normal.  Speech is not dysarthric.  CRANIAL NERVES: II:  No visual field defects.  Unremarkable fundi.   III-IV-VI: Pupils equal round and reactive to light.  Normal conjugate, extra-ocular eye movements in all directions of gaze.  No nystagmus.  No ptosis.   V:  Normal facial sensation.   VII:  Normal facial symmetry and movements.  No pathologic facial reflexes.  VIII:  Normal hearing and vestibular function.   IX-X:  Normal palatal movement.   XI:  Normal shoulder shrug and head rotation.   XII:  Normal tongue strength and range of motion, no deviation or fasciculation.  MOTOR:  No  atrophy, fasciculations or abnormal movements.  No pronator drift.  Tone is normal.    Right Upper Extremity:    Left Upper Extremity:    Deltoid  5/5   Deltoid  5/5   Biceps  5/5   Biceps  5/5   Triceps  5/5   Triceps  5/5   Wrist extensors  5/5   Wrist extensors  5/5   Wrist flexors  5/5   Wrist flexors  5/5   Finger extensors  5/5   Finger extensors  5/5   Finger flexors  5/5   Finger flexors  5/5   Dorsal interossei  5-/5   Dorsal interossei  5-/5   Abductor pollicis  5/5   Abductor pollicis  5/5   Tone (Ashworth scale)  0  Tone (Ashworth scale)  0   Right Lower Extremity:    Left Lower Extremity:    Hip flexors  5/5   Hip  flexors  5/5   Hip extensors  5/5   Hip extensors  5/5   Knee flexors  5/5   Knee flexors  5/5   Knee extensors  5/5   Knee extensors  5/5   Dorsiflexors  5/5   Dorsiflexors  5/5   Plantarflexors  5/5   Plantarflexors  5/5   Toe extensors  5/5   Toe extensors  5/5   Toe flexors  5/5   Toe flexors  5/5   Tone (Ashworth scale)  0  Tone (Ashworth scale)  0   MSRs:  Right                                                                 Left brachioradialis 2+  brachioradialis 2+  biceps 2+  biceps 2+  triceps 2+  triceps 2+  patellar 2+  patellar 2+  ankle jerk 1+  ankle jerk 2+  Hoffman no  Hoffman no  plantar response down  plantar response down   SENSORY:  Vibration reduced to 50% right great toe, 30% left great toe, and intact at the knees.  Light touch, pinprick, temperature, and proprioception is intact.  Romberg's sign absent.   COORDINATION/GAIT: Normal finger-to- nose-finger and heel-to-shin.  Intact rapid alternating movements bilaterally.  Able to rise from a chair without using arms.  Gait narrow based and stable. Tandem and stressed gait intact.    IMPRESSION: Mrs. Gwinner is a 20 year-old female referred for evaluation of bilateral foot paresthesias.  By history, she has burning paresthesias over the dorsum of the foot and numbness involving the  entire lower leg and foot.  This does not conform to a dermatomal or cutaneous nerve distribution, but she does report improvement with gabapentin which favors nerve-related disorder.  She has been screened for a autoimmune disease, thyroid disease, and vitamin B12 deficiency which is normal.  I will order NCS/EMG of the leg to better characterize the nature of her symptoms. Acquired neuropathy is uncommon in individuals her age, however hereditary neuropathy is still possible.    I also counseled her on weight management including health diet choices and exercise.   PLAN/RECOMMENDATIONS:  1.  NCS/EMG of the lower extremities 2.  Start gabapentin '200mg'$  at bedtime (she was drowsy on '300mg'$  at bedtime) 3.  Additional labs to be performed pending results of her NCS/EMG  Return to clinic in 3 months.   The duration of this appointment visit was 40 minutes of face-to-face time with the patient.  Greater than 50% of this time was spent in counseling, explanation of diagnosis, planning of further management, and coordination of care.   Thank you for allowing me to participate in patient's care.  If I can answer any additional questions, I would be pleased to do so.    Sincerely,    Donika K. Posey Pronto, DO

## 2015-08-26 NOTE — Patient Instructions (Addendum)
1.  NCS/EMG of the lower extremities 2.  Start gabapentin  at bedtime  Return to clinic in 64-months

## 2015-09-14 ENCOUNTER — Ambulatory Visit (INDEPENDENT_AMBULATORY_CARE_PROVIDER_SITE_OTHER): Payer: Medicaid Other | Admitting: Neurology

## 2015-09-14 ENCOUNTER — Telehealth: Payer: Self-pay | Admitting: Neurology

## 2015-09-14 DIAGNOSIS — R209 Unspecified disturbances of skin sensation: Secondary | ICD-10-CM

## 2015-09-14 DIAGNOSIS — G609 Hereditary and idiopathic neuropathy, unspecified: Secondary | ICD-10-CM

## 2015-09-14 DIAGNOSIS — IMO0001 Reserved for inherently not codable concepts without codable children: Secondary | ICD-10-CM

## 2015-09-14 NOTE — Telephone Encounter (Signed)
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 Nerve conduction study is hampered by body habitus. She reports feeling better and paresthesias are intermittent and improving. If there is any new worsening of symptoms, she will return to the clinic for further evaluation.  Donika K. Allena KatzPatel, DO

## 2015-09-14 NOTE — Procedures (Signed)
[facility]  [address], [address]  [place], [place] [address] Tel: [phone] Fax:  [phone] Test Date:  09/14/2015  Patient: Claudia PerlaKrystal Guidice DOB: 04-12-1996 Physician: Nita Sickleonika Marleen Moret, DO  Sex: Female Height: 5\' 11"  Ref Phys: Nita Sickleonika Kindred Reidinger, DO  ID#: [id number] Temp: 32.8C Technician: Judie PetitM. Dean   Patient Complaints: This is a 20 year old female referred for evaluation of bilateral feet paresthesias.  NCV & EMG Findings: Extensive electrodiagnostic testing of the left lower extremity and additional studies of the right shows: 1. Bilateral sural and superficial peroneal sensory responses are absent. This is a technically challenging study and these findings may be due to body physiognomy. 2. Bilateral peroneal motor responses show reduced amplitude at the extensor digitorum brevis, however, when recording at the tibialis anterior, peroneal motor responses within normal limits. Bilateral tibial motor responses show normal amplitude and borderline conduction velocity slowing. 3. Bilateral tibial H reflex studies are within normal limits. 4. There is no evidence of active or chronic motor axon loss changes affecting any of the tested muscles.  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  Of note, electrodiagnostic testing is hampered by body physiognomy, especially with respect to sensory responses; clinical correlation is strongly recommended.   ___________________________ Nita Sickleonika Janazia Schreier, DO    Nerve Conduction Studies Anti Sensory Summary Table   Site NR Peak (ms) Norm Peak (ms) P-T Amp (V) Norm P-T Amp  Left Sup Peroneal Anti Sensory (Ant Lat Mall)  32.8C  12 cm NR  <4.4  >6  Right Sup Peroneal Anti Sensory (Ant Lat Mall)  32.8C  12 cm NR  <4.4  >6  Left Sural Anti Sensory (Lat Mall)  32.8C  Calf NR  <4.4  >6  Right Sural Anti  Sensory (Lat Mall)  32.8C  Calf NR  <4.4  >6   Motor Summary Table   Site NR Onset (ms) Norm Onset (ms) O-P Amp (mV) Norm O-P Amp Site1 Site2 Delta-0 (ms) Dist (cm) Vel (m/s) Norm Vel (m/s)  Left Peroneal Motor (Ext Dig Brev)  32.8C  Ankle    4.5 <5.5 1.1 >3 B Fib Ankle 11.4 38.0 33 >41  B Fib    15.9  0.8  Poplt B Fib 2.6 10.0 38 >41  Poplt    18.5  0.5         Right Peroneal Motor (Ext Dig Brev)  32.8C  Ankle    3.9 <5.5 2.1 >3 B Fib Ankle 11.6 42.0 36 >41  B Fib    15.5  1.6  Poplt B Fib 3.2 10.0 31 >41  Poplt    18.7  1.2         Left Peroneal TA Motor (Tib Ant)  32.8C  Fib Head    3.8 <4.0 5.1 >4 Poplit Fib Head 1.6 10.0 62 >41  Poplit    5.4  5.0         Right Peroneal TA Motor (Tib Ant)  32.8C  Fib Head    2.7 <4.0 4.6 >4 Poplit Fib Head 1.9 10.0 53 >41  Poplit    4.6  4.2         Left Tibial Motor (Abd Hall Brev)  32.8C    body habitus behind knee  Ankle    4.6 <5.8 13.3 >8 Knee Ankle 13.4 48.0 36 >41  Knee    18.0  7.9  Right Tibial Motor (Abd Hall Brev)  32.8C    body habitus behind knee  Ankle    4.7 <5.8 10.8 >8 Knee Ankle 12.8 46.0 36 >41  Knee    17.5  5.5          H Reflex Studies   NR H-Lat (ms) Lat Norm (ms) L-R H-Lat (ms)  Left Tibial (Gastroc)  32.8C     32.65 <35 0.00  Right Tibial (Gastroc)  32.8C     32.65 <35 0.00   EMG   Side Muscle Ins Act Fibs Psw Fasc Number Recrt Dur Dur. Amp Amp. Poly Poly. Comment  Right Gastroc Nml Nml Nml Nml Nml Nml Nml Nml Nml Nml Nml Nml N/A  Right AntTibialis Nml Nml Nml Nml Nml Nml Nml Nml Nml Nml Nml Nml N/A  Right RectFemoris Nml Nml Nml Nml Nml Nml Nml Nml Nml Nml Nml Nml N/A  Right Flex Dig Long Nml Nml Nml Nml Nml Nml Nml Nml Nml Nml Nml Nml N/A  Left AntTibialis Nml Nml Nml Nml Nml Nml Nml Nml Nml Nml Nml Nml N/A  Left Gastroc Nml Nml Nml Nml Nml Nml Nml Nml Nml Nml Nml Nml N/A  Left Flex Dig Long Nml Nml Nml Nml Nml Nml Nml Nml Nml Nml Nml Nml N/A  Left RectFemoris Nml Nml Nml Nml Nml Nml Nml Nml  Nml Nml Nml Nml N/A  Left GluteusMed Nml Nml Nml Nml Nml Nml Nml Nml Nml Nml Nml Nml N/A  Left BicepsFemS Nml Nml Nml Nml Nml Nml Nml Nml Nml Nml Nml Nml N/A      Waveforms:

## 2016-08-08 ENCOUNTER — Encounter (HOSPITAL_COMMUNITY): Payer: Self-pay

## 2016-08-08 ENCOUNTER — Emergency Department (HOSPITAL_COMMUNITY)
Admission: EM | Admit: 2016-08-08 | Discharge: 2016-08-08 | Disposition: A | Discharge status: Home / Self Care | Payer: Medicaid Other | Attending: Emergency Medicine | Admitting: Emergency Medicine

## 2016-08-08 DIAGNOSIS — I10 Essential (primary) hypertension: Secondary | ICD-10-CM | POA: Diagnosis not present

## 2016-08-08 DIAGNOSIS — Z79899 Other long term (current) drug therapy: Secondary | ICD-10-CM | POA: Insufficient documentation

## 2016-08-08 DIAGNOSIS — R197 Diarrhea, unspecified: Secondary | ICD-10-CM | POA: Insufficient documentation

## 2016-08-08 DIAGNOSIS — R112 Nausea with vomiting, unspecified: Secondary | ICD-10-CM

## 2016-08-08 LAB — COMPREHENSIVE METABOLIC PANEL
ALT: 23 U/L (ref 14–54)
AST: 45 U/L — AB (ref 15–41)
Albumin: 3.4 g/dL — ABNORMAL LOW (ref 3.5–5.0)
Alkaline Phosphatase: 68 U/L (ref 38–126)
Anion gap: 11 (ref 5–15)
BUN: 7 mg/dL (ref 6–20)
CHLORIDE: 106 mmol/L (ref 101–111)
CO2: 24 mmol/L (ref 22–32)
Calcium: 8.8 mg/dL — ABNORMAL LOW (ref 8.9–10.3)
Creatinine, Ser: 0.79 mg/dL (ref 0.44–1.00)
Glucose, Bld: 117 mg/dL — ABNORMAL HIGH (ref 65–99)
POTASSIUM: 3.7 mmol/L (ref 3.5–5.1)
Sodium: 141 mmol/L (ref 135–145)
TOTAL PROTEIN: 7.9 g/dL (ref 6.5–8.1)
Total Bilirubin: 0.7 mg/dL (ref 0.3–1.2)

## 2016-08-08 LAB — CBC
HEMATOCRIT: 32.5 % — AB (ref 36.0–46.0)
Hemoglobin: 10.4 g/dL — ABNORMAL LOW (ref 12.0–15.0)
MCH: 16.7 pg — ABNORMAL LOW (ref 26.0–34.0)
MCHC: 32 g/dL (ref 30.0–36.0)
MCV: 52.2 fL — AB (ref 78.0–100.0)
Platelets: 203 10*3/uL (ref 150–400)
RBC: 6.23 MIL/uL — ABNORMAL HIGH (ref 3.87–5.11)
RDW: 21.7 % — ABNORMAL HIGH (ref 11.5–15.5)
WBC: 5.8 10*3/uL (ref 4.0–10.5)

## 2016-08-08 LAB — I-STAT BETA HCG BLOOD, ED (MC, WL, AP ONLY)

## 2016-08-08 LAB — LIPASE, BLOOD: LIPASE: 85 U/L — AB (ref 11–51)

## 2016-08-08 MED ORDER — LOPERAMIDE HCL 2 MG PO CAPS: 2.0000 mg | ORAL_CAPSULE | Freq: Four times a day (QID) | ORAL | 0 refills | Status: DC | PRN

## 2016-08-08 MED ORDER — LOPERAMIDE HCL 2 MG PO CAPS
2.0000 mg | ORAL_CAPSULE | Freq: Once | ORAL | Status: AC
  Administered 2016-08-08: 2 mg via ORAL
  Filled 2016-08-08: qty 1

## 2016-08-08 MED ORDER — ONDANSETRON 4 MG PO TBDP
4.0000 mg | ORAL_TABLET | Freq: Once | ORAL | Status: AC
  Administered 2016-08-08: 4 mg via ORAL
  Filled 2016-08-08: qty 1

## 2016-08-08 MED ORDER — ONDANSETRON 4 MG PO TBDP: 4.0000 mg | ORAL_TABLET | Freq: Three times a day (TID) | ORAL | 0 refills | Status: DC | PRN

## 2016-08-08 NOTE — ED Provider Notes (Signed)
I saw and evaluated the patient, reviewed the resident's note and I agree with the findings and plan with the following exceptions.    21 year old female. Likely gastroenteritis. Would recommend supportive care and likely discharge home she appears well. Her abdomen is benign. She does have a mild light. Media however does not have focal pain in the area of her pancreas. Suspect this more related to the vomiting and diarrhea in general. Rest of labs are unremarkable. No urinary symptoms to need a urinalysis.   Marily MemosJason Marvon [place]illingburg, MD 08/08/16 [phone]

## 2016-08-08 NOTE — ED Triage Notes (Signed)
Patient complains of dry cough for 5 days with decreased appetite. Complains of intermittent diarrhea with same. Denies abdominal pain, no nausea

## 2016-08-08 NOTE — ED Notes (Signed)
Pt aware of need for urine  

## 2016-08-08 NOTE — ED Provider Notes (Signed)
[facility]Y [facility]PT Provider Note  CSN: [id number] Arrival date & time: 08/08/16  1119  History   Chief Complaint Chief Complaint  Patient presents with  . decreased appetite/ diarrhea   HPI Claudia Delacruz is a 21 y.o. female.  The history is provided by the patient and medical records. No language interpreter was used.  Illness  This is a new problem. The current episode started more than 2 days ago. The problem occurs constantly. The problem has not changed since onset.Pertinent negatives include no chest pain, no abdominal pain, no headaches and no shortness of breath. The symptoms are aggravated by eating. Nothing relieves the symptoms.   Past Medical History:  Diagnosis Date  . Hypertension   . Vitamin D deficiency    Patient Active Problem List   Diagnosis Date Noted  . Bilateral foot pain 07/26/2015  . Pes planus of both feet 07/26/2015  . Left hand pain 06/01/2015  . SKIN TAG 09/09/2007  . KNEE PAIN, CHRONIC 09/09/2007  . ANEMIA, OTHER, UNSPECIFIED 08/30/2006  . RHINITIS, ALLERGIC 08/30/2006   Past Surgical History:  Procedure Laterality Date  . NO PAST SURGERIES     OB History    No data available     Home Medications    Prior to Admission medications   Medication Sig Start Date End Date Taking? Authorizing Provider  diphenhydrAMINE (BENADRYL) 25 mg capsule Take 25-50 mg by mouth every 6 (six) hours as needed (for seasonal allergies).   Yes Historical Provider, MD  ferrous sulfate 325 (65 FE) MG tablet Take 1 tablet (325 mg total) by mouth 2 (two) times daily with a meal. Patient not taking: Reported on 08/08/2016 06/30/15   Ambrose FinlandValerie A Keck, NP  gabapentin (NEURONTIN) 100 MG capsule Take 2 capsules (200 mg total) by mouth at bedtime. Patient not taking: Reported on 08/08/2016 08/26/15   [place] Chardonika K Patel, DO  loperamide (IMODIUM) 2 MG capsule Take 1 capsule (2 mg total) by mouth 4 (four) times daily as needed for diarrhea or loose stools. 08/08/16   Angelina Okyan Tamikka Pilger, MD    metoprolol succinate (TOPROL-XL) 25 MG 24 hr tablet Take 2 tablets (50 mg total) by mouth daily. Patient not taking: Reported on 08/08/2016 06/30/15   Ambrose FinlandValerie A Keck, NP  ondansetron (ZOFRAN ODT) 4 MG disintegrating tablet Take 1 tablet (4 mg total) by mouth every 8 (eight) hours as needed for nausea or vomiting. 08/08/16   Angelina Okyan Daniya Aramburo, MD  Vitamin D, Ergocalciferol, (DRISDOL) 50000 units CAPS capsule Take 1 capsule (50,000 Units total) by mouth every 7 (seven) days. Patient not taking: Reported on 08/08/2016 07/06/15   Ambrose FinlandValerie A Keck, NP   Family History Family History  Problem Relation Age of Onset  . Healthy Mother   . Diabetes Mellitus II Maternal Grandmother   . Heart failure Maternal Grandfather   . Anemia Sister   . Neuropathy Neg Hx    Social History Social History  Substance Use Topics  . Smoking status: Never Smoker  . Smokeless tobacco: Not on file  . Alcohol use No   Allergies   Patient has no known allergies.  Review of Systems Review of Systems  Constitutional: Positive for appetite change (decreased), chills (resolved) and fever (resolved).  Respiratory: Positive for cough (months, unchanged). Negative for shortness of breath.   Cardiovascular: Negative for chest pain.  Gastrointestinal: Positive for diarrhea, nausea and vomiting (resolved). Negative for abdominal pain.  Neurological: Negative for headaches.  All other systems reviewed and are negative.  Physical  Exam Updated Vital Signs BP (!) 108/54 (BP Location: Right Arm)   Pulse 95   Temp 97.6 F (36.4 C) (Oral)   Resp 20   Ht 5\' 11"  (1.803 m)   Wt (!) 150.1 kg   SpO2 99%   BMI 46.17 kg/m   Physical Exam  Constitutional: She is oriented to person, place, and time. No distress.  Morbidly obese young African-American female  HENT:  Head: Normocephalic and atraumatic.  Eyes: EOM are normal. Pupils are equal, round, and reactive to light.  Neck: Normal range of motion. Neck supple.  Cardiovascular:  Normal rate, regular rhythm and normal heart sounds.   Pulmonary/Chest: Effort normal and breath sounds normal.  Abdominal: Soft. Bowel sounds are normal. She exhibits no distension. There is no tenderness.  Musculoskeletal: Normal range of motion.  Neurological: She is alert and oriented to person, place, and time.  Skin: Skin is warm and dry. Capillary refill takes less than 2 seconds. She is not diaphoretic.  Nursing note and vitals reviewed.  ED Treatments / Results  Labs (all labs ordered are listed, but only abnormal results are displayed) Labs Reviewed  LIPASE, BLOOD - Abnormal; Notable for the following:       Result Value   Lipase 85 (*)    All other components within normal limits  COMPREHENSIVE METABOLIC PANEL - Abnormal; Notable for the following:    Glucose, Bld 117 (*)    Calcium 8.8 (*)    Albumin 3.4 (*)    AST 45 (*)    All other components within normal limits  CBC - Abnormal; Notable for the following:    RBC 6.23 (*)    Hemoglobin 10.4 (*)    HCT 32.5 (*)    MCV 52.2 (*)    MCH 16.7 (*)    RDW 21.7 (*)    All other components within normal limits  URINALYSIS, ROUTINE W REFLEX MICROSCOPIC  I-STAT BETA HCG BLOOD, ED ([facility], [facility], [facility] ONLY)   EKG  EKG Interpretation None      Radiology No results found.  Procedures Procedures (including critical care time)  Medications Ordered in ED Medications  ondansetron (ZOFRAN-ODT) disintegrating tablet 4 mg (not administered)  loperamide (IMODIUM) capsule 2 mg (not administered)    Initial Impression / Assessment and Plan / ED Course  I have reviewed the triage vital signs and the nursing notes.  21 y.o. female with above stated PMHx, HPI, and physical. Symptom onset x6 days ago. Ate hotdog from school prior to onset of symptoms. Initially with nausea and nonbloody nonbilious emesis. Now patient has had persistent watery nonbloody diarrhea. Emesis has resolved but with decreased appetite. Patient still able to  tolerate by mouth at home. Patient denies recent travel or antibiotics or hospitalization. Patient denies vaginal bleeding, vaginal discharge, dysuria, urinary frequency, urinary urgency, urine malodor.   CMP unremarkable. CBC without leukocytosis but notable for chronic anemia. Lipase mildly elevated to 85. Serum beta hCG negative. Laboratory results were personally reviewed by myself and used in the medical decision making of this patient's treatment and disposition.  Suspect viral gastroenteritis. Patient otherwise well-appearing on exam. Able to tolerate by mouth in the emergency department. Patient given Zofran and loperamide and prescription for the same. Pt discharged home in stable condition. Strict ED return precautions dicussed. Pt understands and agrees with the plan and has no further questions or concerns.  Pt care discussed with and followed by my attending, Dr. Marily Memos  Angelina Ok, MD Pager #  [phone]  Final Clinical Impressions(s) / ED Diagnoses   Final diagnoses:  Nausea vomiting and diarrhea   New Prescriptions New Prescriptions   LOPERAMIDE (IMODIUM) 2 MG CAPSULE    Take 1 capsule (2 mg total) by mouth 4 (four) times daily as needed for diarrhea or loose stools.   ONDANSETRON (ZOFRAN ODT) 4 MG DISINTEGRATING TABLET    Take 1 tablet (4 mg total) by mouth every 8 (eight) hours as needed for nausea or vomiting.     Angelina Ok, MD 08/08/16 1610    Marily Memos, MD 08/08/16 [id number]

## 2020-09-28 ENCOUNTER — Other Ambulatory Visit: Payer: Self-pay

## 2020-09-28 ENCOUNTER — Encounter (HOSPITAL_COMMUNITY): Payer: Self-pay | Admitting: Emergency Medicine

## 2020-09-28 ENCOUNTER — Ambulatory Visit (HOSPITAL_COMMUNITY)
Admission: EM | Admit: 2020-09-28 | Discharge: 2020-09-28 | Disposition: A | Discharge status: Home / Self Care | Payer: Self-pay | Attending: Family Medicine | Admitting: Family Medicine

## 2020-09-28 DIAGNOSIS — M5431 Sciatica, right side: Secondary | ICD-10-CM

## 2020-09-28 DIAGNOSIS — I10 Essential (primary) hypertension: Secondary | ICD-10-CM

## 2020-09-28 MED ORDER — PREDNISONE 20 MG PO TABS: 40.0000 mg | ORAL_TABLET | Freq: Every day | ORAL | 0 refills | Status: AC

## 2020-09-28 MED ORDER — DEXAMETHASONE SODIUM PHOSPHATE 10 MG/ML IJ SOLN
10.0000 mg | Freq: Once | INTRAMUSCULAR | Status: AC
  Administered 2020-09-28: 10 mg via INTRAMUSCULAR

## 2020-09-28 MED ORDER — METOPROLOL SUCCINATE ER 25 MG PO TB24: 50.0000 mg | ORAL_TABLET | Freq: Every day | ORAL | 2 refills | Status: DC

## 2020-09-28 MED ORDER — KETOROLAC TROMETHAMINE 30 MG/ML IJ SOLN
30.0000 mg | Freq: Once | INTRAMUSCULAR | Status: AC
  Administered 2020-09-28: 30 mg via INTRAMUSCULAR

## 2020-09-28 MED ORDER — KETOROLAC TROMETHAMINE 30 MG/ML IJ SOLN
INTRAMUSCULAR | Status: AC
  Filled 2020-09-28: qty 1

## 2020-09-28 MED ORDER — DEXAMETHASONE SODIUM PHOSPHATE 10 MG/ML IJ SOLN
INTRAMUSCULAR | Status: AC
  Filled 2020-09-28: qty 1

## 2020-09-28 NOTE — Discharge Instructions (Addendum)
You have received a steroid injection in the office today as well as an injection of toradol for pain  I have sent in prednisone for you to take 40mg  (2 tabs) once a day for 5 days  You may take ibuprofen and tylenol as needed for pain  May use heat and topical rubs to the area as needed  Follow up with this office or with primary care if symptoms are persisting.  Follow up in the ER for high fever, trouble swallowing, trouble breathing, other concerning symptoms.

## 2020-09-28 NOTE — ED Triage Notes (Signed)
Pt presents with lower back pain that radiates into right leg xs 1-2 month, states last 3 days has gotten much worse.

## 2020-09-28 NOTE — ED Provider Notes (Signed)
[facility]   [id number] 09/28/20 Arrival Time: 1506  HU:[id number] PAIN  SUBJECTIVE: History from: patient. Claudia Delacruz is a 25 y.o. female complains of right low back pain that began a few months ago. Reports that she has been managing at home with OTC medications. Denies a precipitating event or specific injury.  Localizes the pain to the right low back that radiates to R hip and down the R leg for the last 2-3 days. Describes the pain as constant and achy in character with intermittent sharp pain. Has tried OTC medications without relief. Symptoms are made worse with activity. Denies similar symptoms in the past.  Denies fever, chills, erythema, ecchymosis, effusion, weakness, numbness and tingling, saddle paresthesias, loss of bowel or bladder function.     Also reports that she is supposed to be on HTN medication but that she ran out of her medication "a while ago."  Does not check BP at home regularly. Denies headache, changes in vision, dizziness, fatigue, fever, other symptoms.  ROS: As per HPI.  All other pertinent ROS negative.     Past Medical History:  Diagnosis Date  . Hypertension   . Vitamin D deficiency    Past Surgical History:  Procedure Laterality Date  . NO PAST SURGERIES     No Known Allergies No current facility-administered medications on file prior to encounter.   Current Outpatient Medications on File Prior to Encounter  Medication Sig Dispense Refill  . diphenhydrAMINE (BENADRYL) 25 mg capsule Take 25-50 mg by mouth every 6 (six) hours as needed (for seasonal allergies).    . ferrous sulfate 325 (65 FE) MG tablet Take 1 tablet (325 mg total) by mouth 2 (two) times daily with a meal. (Patient not taking: Reported on 08/08/2016) 60 tablet 3  . gabapentin (NEURONTIN) 100 MG capsule Take 2 capsules (200 mg total) by mouth at bedtime. (Patient not taking: Reported on 08/08/2016) 60 capsule 3  . loperamide (IMODIUM) 2 MG capsule Take 1 capsule (2 mg total)  by mouth 4 (four) times daily as needed for diarrhea or loose stools. 12 capsule 0  . ondansetron (ZOFRAN ODT) 4 MG disintegrating tablet Take 1 tablet (4 mg total) by mouth every 8 (eight) hours as needed for nausea or vomiting. 20 tablet 0  . Vitamin D, Ergocalciferol, (DRISDOL) 50000 units CAPS capsule Take 1 capsule (50,000 Units total) by mouth every 7 (seven) days. (Patient not taking: Reported on 08/08/2016) 12 capsule 0   Social History   Socioeconomic History  . Marital status: Single    Spouse name: Not on file  . Number of children: Not on file  . Years of education: Not on file  . Highest education level: Not on file  Occupational History  . Not on file  Tobacco Use  . Smoking status: Never Smoker  . Smokeless tobacco: Never Used  Substance and Sexual Activity  . Alcohol use: No  . Drug use: No  . Sexual activity: Not on file  Other Topics Concern  . Not on file  Social History Narrative   She lives with mother.     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   Social Determinants of Health   Financial Resource Strain: Not on file  Food Insecurity: Not on file  Transportation Needs: Not on file  Physical Activity: Not on file  Stress: Not on file  Social Connections: Not on file  Intimate Partner Violence: Not on file  Family History  Problem Relation Age of Onset  . Healthy Mother   . Diabetes Mellitus II Maternal Grandmother   . Heart failure Maternal Grandfather   . Anemia Sister   . Neuropathy Neg Hx     OBJECTIVE:  Vitals:   09/28/20 1546 09/28/20 1607  BP: (!) 197/106 (!) 176/121  Pulse: 87   Resp: 18   Temp: 98.6 F (37 C)   TempSrc: Oral   SpO2: 96%     General appearance: ALERT; in no acute distress.  Head: NCAT Lungs: Normal respiratory effort CV: pulses 2+ bilaterally. Cap refill < 2 seconds Musculoskeletal:  Inspection: Skin warm, dry, clear and intact No erythema, effusion noted Palpation: R  low back tender to palpation, + R SLR ROM: Limited ROM active and passive to low back with bending, sitting and twisting Skin: warm and dry Neurologic: Ambulates without difficulty; Sensation intact about the upper/ lower extremities Psychological: alert and cooperative; normal mood and affect  DIAGNOSTIC STUDIES:  No results found.   ASSESSMENT & PLAN:  1. Accelerated hypertension   2. Right sided sciatica   3. Essential hypertension     Meds ordered this encounter  Medications  . ketorolac (TORADOL) 30 MG/ML injection 30 mg  . dexamethasone (DECADRON) injection 10 mg  . metoprolol succinate (TOPROL-XL) 25 MG 24 hr tablet    Sig: Take 2 tablets (50 mg total) by mouth daily.    Dispense:  30 tablet    Refill:  2    Order Specific Question:   Supervising Provider    Answer:   Merrilee Jansky [id number]  . predniSONE (DELTASONE) 20 MG tablet    Sig: Take 2 tablets (40 mg total) by mouth daily with breakfast for 5 days.    Dispense:  10 tablet    Refill:  0    Order Specific Question:   Supervising Provider    Answer:   Merrilee Jansky [[id number]]   Toradol 30mg  IM in office today  Decadron 10mg  IM in office today as well Prescribed prednisone 40mg  daily for 5 days Refilled Toprol XL 25mg   Monitor BP at home and follow up with PCP Continue conservative management of rest, ice, and gentle stretches Take ibuprofen as needed for pain relief (may cause abdominal discomfort, ulcers, and GI bleeds avoid taking with other NSAIDs)  Follow up with PCP if symptoms persist Return or go to the ER if you have any new or worsening symptoms (fever, chills, chest pain, abdominal pain, changes in bowel or bladder habits, pain radiating into lower legs)   Reviewed expectations re: course of current medical issues. Questions answered. Outlined signs and symptoms indicating need for more acute intervention. Patient verbalized understanding. After Visit Summary given.       , NP 09/28/20 1701

## 2020-10-20 ENCOUNTER — Other Ambulatory Visit: Payer: Self-pay | Admitting: Sports Medicine

## 2020-10-20 DIAGNOSIS — M545 Low back pain, unspecified: Secondary | ICD-10-CM

## 2020-10-30 ENCOUNTER — Other Ambulatory Visit: Payer: Self-pay

## 2020-10-30 ENCOUNTER — Ambulatory Visit
Admission: RE | Admit: 2020-10-30 | Discharge: 2020-10-30 | Disposition: A | Discharge status: Home / Self Care | Payer: Medicaid Other | Source: Ambulatory Visit | Attending: Sports Medicine | Admitting: Sports Medicine

## 2020-10-30 DIAGNOSIS — M545 Low back pain, unspecified: Secondary | ICD-10-CM

## 2020-11-10 ENCOUNTER — Other Ambulatory Visit: Payer: Self-pay

## 2020-11-10 ENCOUNTER — Ambulatory Visit
Admission: RE | Admit: 2020-11-10 | Discharge: 2020-11-10 | Disposition: A | Discharge status: Home / Self Care | Payer: No Typology Code available for payment source | Source: Ambulatory Visit | Attending: Sports Medicine | Admitting: Sports Medicine

## 2020-11-15 ENCOUNTER — Other Ambulatory Visit: Payer: Self-pay | Admitting: Sports Medicine

## 2020-11-15 DIAGNOSIS — M545 Low back pain, unspecified: Secondary | ICD-10-CM

## 2020-11-15 DIAGNOSIS — G8929 Other chronic pain: Secondary | ICD-10-CM

## 2020-11-25 ENCOUNTER — Other Ambulatory Visit: Payer: Self-pay

## 2020-11-25 ENCOUNTER — Ambulatory Visit
Admission: RE | Admit: 2020-11-25 | Discharge: 2020-11-25 | Disposition: A | Discharge status: Home / Self Care | Payer: No Typology Code available for payment source | Source: Ambulatory Visit | Attending: Sports Medicine | Admitting: Sports Medicine

## 2020-11-25 DIAGNOSIS — M545 Low back pain, unspecified: Secondary | ICD-10-CM

## 2020-11-25 DIAGNOSIS — G8929 Other chronic pain: Secondary | ICD-10-CM

## 2020-11-25 MED ORDER — METHYLPREDNISOLONE ACETATE 40 MG/ML INJ SUSP (RADIOLOG
80.0000 mg | Freq: Once | INTRAMUSCULAR | Status: AC
  Administered 2020-11-25: 80 mg via EPIDURAL

## 2020-11-25 MED ORDER — IOPAMIDOL (ISOVUE-M 200) INJECTION 41%
1.0000 mL | Freq: Once | INTRAMUSCULAR | Status: AC
  Administered 2020-11-25: 1 mL via EPIDURAL

## 2020-11-25 NOTE — Discharge Instructions (Signed)
Post Procedure Spinal Discharge Instruction Sheet  1. You may resume a regular diet and any medications that you routinely take (including pain medications).  2. No driving day of procedure.  3. Light activity throughout the rest of the day.  Do not do any strenuous work, exercise, bending or lifting.  The day following the procedure, you can resume normal physical activity but you should refrain from exercising or physical therapy for at least three days thereafter.   Common Side Effects:   Headaches- take your usual medications as directed by your physician.  Increase your fluid intake.  Caffeinated beverages may be helpful.  Lie flat in bed until your headache resolves.   Restlessness or inability to sleep- you may have trouble sleeping for the next few days.  Ask your referring physician if you need any medication for sleep.   Facial flushing or redness- should subside within a few days.   Increased pain- a temporary increase in pain a day or two following your procedure is not unusual.  Take your pain medication as prescribed by your referring physician.   Leg cramps  Please contact our office at [phone] for the following symptoms:  Fever greater than 100 degrees.  Headaches unresolved with medication after 2-3 days.  Increased swelling, pain, or redness at injection site.   Thank you for visiting [facility] today.  

## 2022-04-04 IMAGING — XA Imaging study
2 series · 2 of 2 positions shown · non-contrast
Comparison: none

CLINICAL DATA: 24-year-old female with lower back pain and
right-sided sciatica. MRI demonstrates central L4-L5 and L5-S1
central disc bulges contacting the L5 and S1 nerve roots.

[Series 2: ortho adipose · 1 of 1 slices shown (1 of 2)]
[im 1/1]
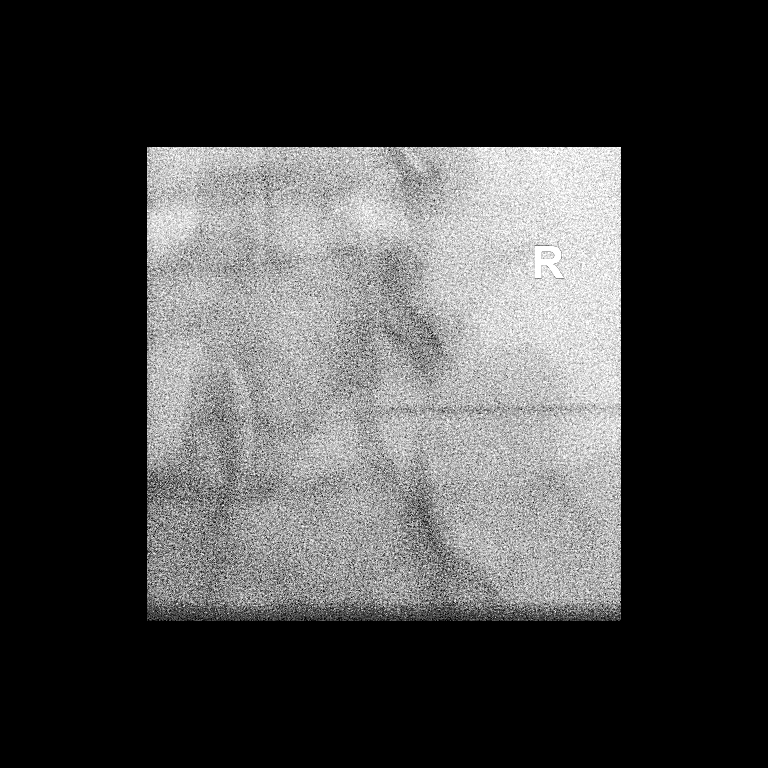

[Series 3: ortho adipose · 1 of 1 slices shown (2 of 2)]
[im 1/1]
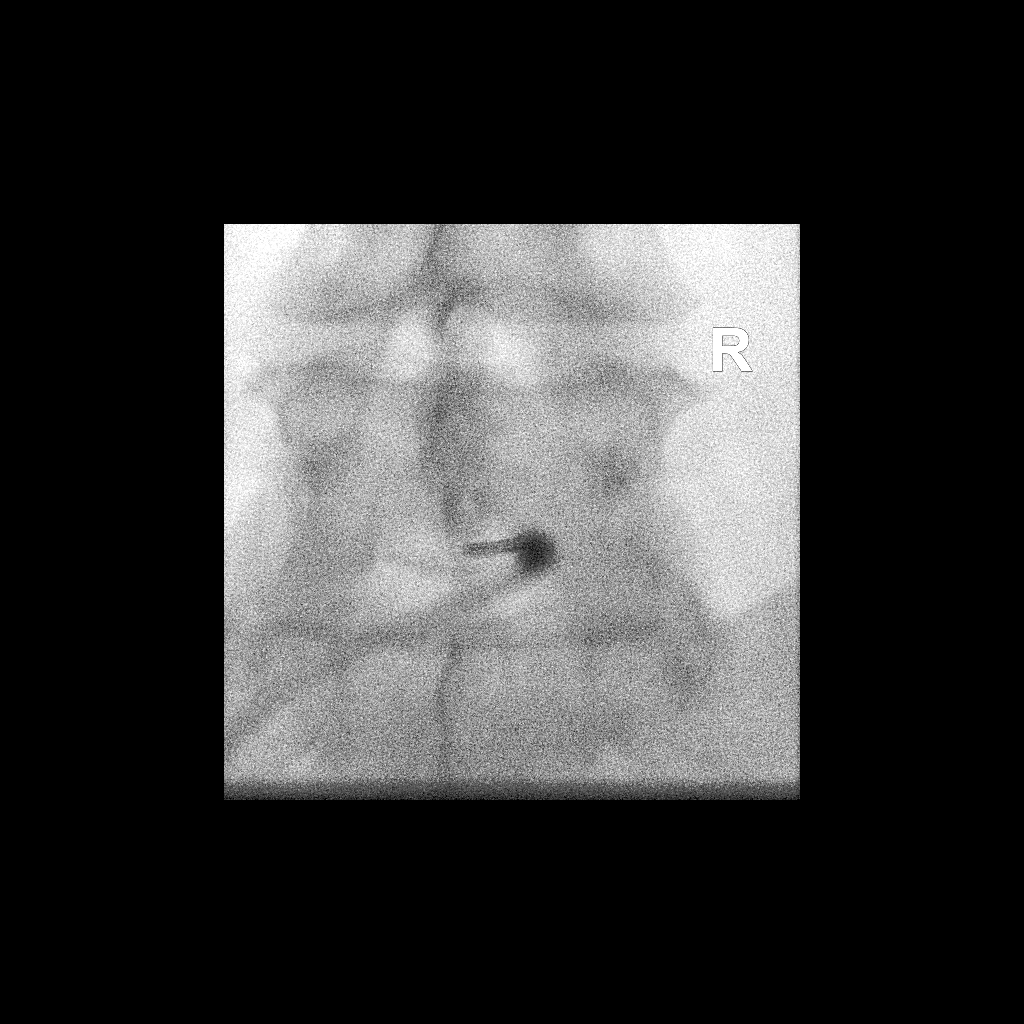

[2 of 2 positions shown; findings below may reference images not displayed]

FLUOROSCOPY TIME:  Dictate in minutes and seconds

PROCEDURE:
The procedure, risks, benefits, and alternatives were explained to
the patient. Questions regarding the procedure were encouraged and
answered. The patient understands and consents to the procedure.

LUMBAR EPIDURAL INJECTION:

An interlaminar approach was performed on right at L5-S1. The
overlying skin was cleansed and anesthetized. A 20 gauge epidural
needle was advanced using loss-of-resistance technique.

DIAGNOSTIC EPIDURAL INJECTION:

Injection of Isovue-M 200 shows a good epidural pattern with spread
above and below the level of needle placement, primarily on the
right no vascular opacification is seen.

THERAPEUTIC EPIDURAL INJECTION:

80 mg of Depo-Medrol mixed with 2 mL 1% lidocaine were instilled.
The procedure was well-tolerated, and the patient was discharged
thirty minutes following the injection in good condition.

COMPLICATIONS:
None.
IMPRESSION: Technically successful epidural injection on the right L5-S1 #1.

## 2022-11-07 ENCOUNTER — Ambulatory Visit (HOSPITAL_COMMUNITY)
Admission: EM | Admit: 2022-11-07 | Discharge: 2022-11-07 | Disposition: A | Discharge status: Home / Self Care | Payer: No Typology Code available for payment source | Attending: Family Medicine | Admitting: Family Medicine

## 2022-11-07 ENCOUNTER — Encounter (HOSPITAL_COMMUNITY): Payer: Self-pay | Admitting: Emergency Medicine

## 2022-11-07 DIAGNOSIS — I10 Essential (primary) hypertension: Secondary | ICD-10-CM | POA: Diagnosis not present

## 2022-11-07 DIAGNOSIS — E860 Dehydration: Secondary | ICD-10-CM

## 2022-11-07 LAB — POCT URINALYSIS DIP (MANUAL ENTRY)
Bilirubin, UA: NEGATIVE
Blood, UA: NEGATIVE
Glucose, UA: NEGATIVE mg/dL
Ketones, POC UA: NEGATIVE mg/dL
Leukocytes, UA: NEGATIVE
Nitrite, UA: NEGATIVE
Protein Ur, POC: NEGATIVE mg/dL
Spec Grav, UA: 1.02 (ref 1.010–1.025)
Urobilinogen, UA: 0.2 E.U./dL
pH, UA: 7 (ref 5.0–8.0)

## 2022-11-07 LAB — POCT URINE PREGNANCY: Preg Test, Ur: NEGATIVE

## 2022-11-07 MED ORDER — METOPROLOL SUCCINATE ER 25 MG PO TB24: 50.0000 mg | ORAL_TABLET | Freq: Every day | ORAL | 0 refills | Status: DC

## 2022-11-07 NOTE — Discharge Instructions (Addendum)
Keep follow-up with primary care appointment.  Take medication as prescribed. Return if symptoms worsen.

## 2022-11-07 NOTE — ED Triage Notes (Addendum)
Pt reports waking up around 5 am this morning with dizziness and nausea.  States the dizziness lasted about 1 hr and resolved. Still has some nausea. Denies CP and shortness of breath.  Hx of HTN, has not taken BP meds in 2 years due to not having a PCP.

## 2022-11-07 NOTE — ED Provider Notes (Signed)
[facility]    CSN: [id number] Arrival date & time: 11/07/22  1010      History   Chief Complaint Chief Complaint  Patient presents with   Dizziness   Nausea    HPI Claudia Delacruz is a 27 y.o. female.   HPI Patient with a history of hypertension, obesity, allergic rhinitis present today with complaints of dizziness upon awakening and nausea.  Patient has been without metoprolol which was previously prescribed for treatment of hypertension due to being without a primary care provider.  Patient has a blood pressure cuff at home and reports she occasionally checks her blood pressure and pressure readings have been elevated.  Patient has an appointment with her primary care provider on 11/09/2022.  She reports she has continued to have some intermittent dizziness since this morning but denies any headache, blurring of vision, chest pain, or shortness of breath.  She also denies any new swelling but reports chronic lower extremity swelling that normally resolves with rest and elevation in the afternoon. Past Medical History:  Diagnosis Date   Hypertension    Vitamin D deficiency     Patient Active Problem List   Diagnosis Date Noted   Bilateral foot pain 07/26/2015   Pes planus of both feet 07/26/2015   Left hand pain 06/01/2015   SKIN TAG 09/09/2007   KNEE PAIN, CHRONIC 09/09/2007   ANEMIA, OTHER, UNSPECIFIED 08/30/2006   RHINITIS, ALLERGIC 08/30/2006    Past Surgical History:  Procedure Laterality Date   NO PAST SURGERIES      OB History   No obstetric history on file.      Home Medications    Prior to Admission medications   Medication Sig Start Date End Date Taking? Authorizing Provider  diphenhydrAMINE (BENADRYL) 25 mg capsule Take 25-50 mg by mouth every 6 (six) hours as needed (for seasonal allergies).    [provider]  ferrous sulfate 325 (65 FE) MG tablet Take 1 tablet (325 mg total) by mouth 2 (two) times daily with a meal. Patient not  taking: Reported on 08/08/2016 06/30/15   Ambrose Finland, NP  gabapentin (NEURONTIN) 100 MG capsule Take 2 capsules (200 mg total) by mouth at bedtime. Patient not taking: Reported on 08/08/2016 08/26/15   Nita Sickle K, DO  loperamide (IMODIUM) 2 MG capsule Take 1 capsule (2 mg total) by mouth 4 (four) times daily as needed for diarrhea or loose stools. 08/08/16   Angelina Ok, MD  metoprolol succinate (TOPROL-XL) 25 MG 24 hr tablet Take 2 tablets (50 mg total) by mouth daily. 11/07/22   Bing Neighbors, NP  ondansetron (ZOFRAN ODT) 4 MG disintegrating tablet Take 1 tablet (4 mg total) by mouth every 8 (eight) hours as needed for nausea or vomiting. 08/08/16   Angelina Ok, MD  Vitamin D, Ergocalciferol, (DRISDOL) 50000 units CAPS capsule Take 1 capsule (50,000 Units total) by mouth every 7 (seven) days. Patient not taking: Reported on 08/08/2016 07/06/15   Ambrose Finland, NP    Family History Family History  Problem Relation Age of Onset   Healthy Mother    Diabetes Mellitus II Maternal Grandmother    Heart failure Maternal Grandfather    Anemia Sister    Neuropathy Neg Hx     Social History Social History   Tobacco Use   Smoking status: Never   Smokeless tobacco: Never  Substance Use Topics   Alcohol use: No   Drug use: No     Allergies  Patient has no known allergies.   Review of Systems Review of Systems Pertinent negatives listed in HPI   Physical Exam Triage Vital Signs ED Triage Vitals [11/07/22 1152]  Enc Vitals Group     BP (!) 164/90     Pulse Rate (!) 102     Resp 18     Temp 98.3 F (36.8 C)     Temp Source Oral     SpO2 99 %     Weight      Height      Head Circumference      Peak Flow      Pain Score 0     Pain Loc      Pain Edu?      Excl. in GC?    No data found.  Updated Vital Signs BP (!) 164/90 (BP Location: Right Arm)   Pulse (!) 102   Temp 98.3 F (36.8 C) (Oral)   Resp 18   SpO2 99%   Visual Acuity Right Eye Distance:    Left Eye Distance:   Bilateral Distance:    Right Eye Near:   Left Eye Near:    Bilateral Near:     Physical Exam Constitutional:      Appearance: She is obese.  HENT:     Head: Normocephalic.  Eyes:     Extraocular Movements: Extraocular movements intact.     Pupils: Pupils are equal, round, and reactive to light.  Cardiovascular:     Rate and Rhythm: Regular rhythm. Tachycardia present.  Pulmonary:     Effort: Pulmonary effort is normal.     Breath sounds: Normal breath sounds.  Musculoskeletal:        General: Swelling present.     Cervical back: Normal range of motion.     Comments: Nonpitting lower extremity swelling laterally  Skin:    General: Skin is warm and dry.  Neurological:     General: No focal deficit present.     Mental Status: She is alert.      UC Treatments / Results  Labs (all labs ordered are listed, but only abnormal results are displayed) Labs Reviewed  POCT URINE PREGNANCY  POCT URINALYSIS DIP (MANUAL ENTRY)    EKG   Radiology No results found.  Procedures Procedures (including critical care time)  Medications Ordered in UC Medications - No data to display  Initial Impression / Assessment and Plan / UC Course  I have reviewed the triage vital signs and the nursing notes.  Pertinent labs & imaging results that were available during my care of the patient were reviewed by me and considered in my medical decision making (see chart for details).    Accelerated hypertension, patient has been without her medication for quite some time.  She is mildly tachycardic.  Restarted metoprolol 50 mg once daily.  Patient will see and establish with a new primary care provider this week.  UA has not elevated SPG and patient admits to not drinking much water dizziness could be related to dehydration.  Advised patient to hydrate well with water.  Advised to go to the ED if her symptoms worsen or become severe. Final Clinical Impressions(s) / UC  Diagnoses   Final diagnoses:  Accelerated hypertension  Dehydration     Discharge Instructions      Keep follow-up with primary care appointment.  Take medication as prescribed. Return if symptoms worsen.   ED Prescriptions     Medication Sig Dispense Auth. Provider  metoprolol succinate (TOPROL-XL) 25 MG 24 hr tablet Take 2 tablets (50 mg total) by mouth daily. 30 tablet Bing Neighbors, NP      PDMP not reviewed this encounter.   Bing Neighbors, NP 11/07/22 [phone]

## 2022-11-09 ENCOUNTER — Ambulatory Visit: Payer: No Typology Code available for payment source | Admitting: Family Medicine

## 2022-11-09 ENCOUNTER — Telehealth: Payer: Self-pay

## 2022-11-09 NOTE — Telephone Encounter (Signed)
Pt was a no show for a np appt with Dr Janee Morn on 11/09/22, I sent a no show letter.

## 2022-11-24 NOTE — Telephone Encounter (Signed)
no show for new patient, unable to reschedule at [facility]  

## 2022-12-21 ENCOUNTER — Ambulatory Visit: Payer: No Typology Code available for payment source | Admitting: Family

## 2022-12-28 NOTE — Progress Notes (Signed)
Subjective:    Claudia Delacruz - 27 y.o. female MRN [id number]  Date of birth: 1995-11-11  HPI  AURORAH CARRIERO is to establish care and Urgent Care follow-up.   Current issues and/or concerns: 11/07/2022 [facility] per NP note: Initial Impression / Assessment and Plan / UC Course  I have reviewed the triage vital signs and the nursing notes.   Pertinent labs & imaging results that were available during my care of the patient were reviewed by me and considered in my medical decision making (see chart for details).   Accelerated hypertension, patient has been without her medication for quite some time.  She is mildly tachycardic.  Restarted metoprolol 50 mg once daily.  Patient will see and establish with a new primary care provider this week.  UA has not elevated SPG and patient admits to not drinking much water dizziness could be related to dehydration.  Advised patient to hydrate well with water.  Advised to go to the ED if her symptoms worsen or become severe.  Today's visit 01/01/2023: - Doing well on Metoprolol, no issues/concerns. She is trying to monitor salt intake. She does not check her blood pressure outside of office. She does not exercise outside of her normal routine. She does not complain of red flag symptoms such as but not limited to chest pain, shortness of breath, worst headache of life, nausea/vomiting.  - Requests anemia screen.  - Requests vitamin D screen. - No further issues/concerns for discussion today.    ROS per HPI     Health Maintenance:  Health Maintenance Due  Topic Date Due   COVID-19 Vaccine (1) Never done   HPV VACCINES (2 - 2-dose series) 03/11/2008   HIV Screening  Never done   Hepatitis C Screening  Never done   PAP-Cervical Cytology Screening  Never done   PAP SMEAR-Modifier  Never done   DTaP/Tdap/Td (2 - Tdap) 09/08/2017     Past Medical History: Patient Active Problem List   Diagnosis Date Noted    Bilateral foot pain 07/26/2015   Pes planus of both feet 07/26/2015   Left hand pain 06/01/2015   SKIN TAG 09/09/2007   KNEE PAIN, CHRONIC 09/09/2007   ANEMIA, OTHER, UNSPECIFIED 08/30/2006   RHINITIS, ALLERGIC 08/30/2006      Social History   reports that she has never smoked. She has never used smokeless tobacco. She reports that she does not drink alcohol and does not use drugs.   Family History  family history includes Anemia in her sister; Diabetes Mellitus II in her maternal grandmother; Healthy in her mother; Heart failure in her maternal grandfather.   Medications: reviewed and updated   Objective:   Physical Exam BP 133/81   Pulse (!) 101   Temp 98.8 F (37.1 C) (Oral)   Ht 6\' 1"  (1.854 m)   Wt (!) 335 lb 12.8 oz (152.3 kg)   LMP 12/11/2022 (Exact Date)   SpO2 97%   BMI 44.30 kg/m   Physical Exam HENT:     Head: Normocephalic and atraumatic.     Nose: Nose normal.     Mouth/Throat:     Mouth: Mucous membranes are moist.     Pharynx: Oropharynx is clear.  Eyes:     Extraocular Movements: Extraocular movements intact.     Conjunctiva/sclera: Conjunctivae normal.     Pupils: Pupils are equal, round, and reactive to light.  Cardiovascular:     Rate and Rhythm: Tachycardia present.  Pulses: Normal pulses.     Heart sounds: Normal heart sounds.  Pulmonary:     Effort: Pulmonary effort is normal.     Breath sounds: Normal breath sounds.  Musculoskeletal:        General: Normal range of motion.     Cervical back: Normal range of motion and neck supple.  Neurological:     General: No focal deficit present.     Mental Status: She is alert and oriented to person, place, and time.  Psychiatric:        Mood and Affect: Mood normal.        Behavior: Behavior normal.        Assessment & Plan:  1. Encounter to establish care - Patient presents today to establish care. During the interim follow-up with primary provider as scheduled.  - Return for annual  physical examination, labs, and health maintenance. Arrive fasting meaning having no food for at least 8 hours prior to appointment. You may have only water or black coffee. Please take scheduled medications as normal.  2. Primary hypertension - Continue Metoprolol Succinate as prescribed.  - Routine screening.  - Counseled on blood pressure goal of less than 130/80, low-sodium, DASH diet, medication compliance, and 150 minutes of moderate intensity exercise per week as tolerated. Counseled on medication adherence and adverse effects. - Follow-up with primary provider in 3 months or sooner if needed.  - CMP14+EGFR - metoprolol succinate (TOPROL-XL) 25 MG 24 hr tablet; Take 2 tablets (50 mg total) by mouth daily.  Dispense: 180 tablet; Refill: 0  3. Dehydration - Resolved. - Routine screening.  - CMP14+EGFR  4. Diabetes mellitus screening - Routine screening.  - Hemoglobin A1c  5. Screening cholesterol level - Routine screening.  - Lipid panel  6. Screening for deficiency anemia - Routine screening.  - CBC  7. Vitamin D deficiency - Routine screening.  - Vitamin D, 25-hydroxy    Patient was given clear instructions to go to Emergency Department or return to medical center if symptoms don't improve, worsen, or new problems develop.The patient verbalized understanding.  I discussed the assessment and treatment plan with the patient. The patient was provided an opportunity to ask questions and all were answered. The patient agreed with the plan and demonstrated an understanding of the instructions.   The patient was advised to call back or seek an in-person evaluation if the symptoms worsen or if the condition fails to improve as anticipated.    Ricky Stabs, NP 01/01/2023, 3:07 PM Primary Care at [facility]

## 2023-01-01 ENCOUNTER — Encounter: Payer: Self-pay | Admitting: Family

## 2023-01-01 ENCOUNTER — Ambulatory Visit (INDEPENDENT_AMBULATORY_CARE_PROVIDER_SITE_OTHER): Payer: No Typology Code available for payment source | Admitting: Family

## 2023-01-01 VITALS — BP 133/81 | HR 101 | Temp 98.8°F | Ht 73.0 in | Wt 335.8 lb

## 2023-01-01 DIAGNOSIS — I10 Essential (primary) hypertension: Secondary | ICD-10-CM | POA: Diagnosis not present

## 2023-01-01 DIAGNOSIS — Z7689 Persons encountering health services in other specified circumstances: Secondary | ICD-10-CM | POA: Diagnosis not present

## 2023-01-01 DIAGNOSIS — E559 Vitamin D deficiency, unspecified: Secondary | ICD-10-CM | POA: Diagnosis not present

## 2023-01-01 DIAGNOSIS — E860 Dehydration: Secondary | ICD-10-CM

## 2023-01-01 DIAGNOSIS — Z13220 Encounter for screening for lipoid disorders: Secondary | ICD-10-CM

## 2023-01-01 DIAGNOSIS — Z131 Encounter for screening for diabetes mellitus: Secondary | ICD-10-CM

## 2023-01-01 DIAGNOSIS — Z130 Encounter for screening for diseases of the blood and blood-forming organs and certain disorders involving the immune mechanism: Secondary | ICD-10-CM

## 2023-01-01 MED ORDER — METOPROLOL SUCCINATE ER 25 MG PO TB24: 50.0000 mg | ORAL_TABLET | Freq: Every day | ORAL | 0 refills | Status: DC

## 2023-01-01 NOTE — Progress Notes (Signed)
Pt is unsure of vitamin D def, that is stated in her past medical history.   Pt wants to see if she is anemic.

## 2023-01-02 ENCOUNTER — Other Ambulatory Visit: Payer: Self-pay | Admitting: Family

## 2023-01-02 DIAGNOSIS — E559 Vitamin D deficiency, unspecified: Secondary | ICD-10-CM

## 2023-01-02 DIAGNOSIS — D509 Iron deficiency anemia, unspecified: Secondary | ICD-10-CM

## 2023-01-02 LAB — CBC
Hematocrit: 29.4 % — ABNORMAL LOW (ref 34.0–46.6)
Hemoglobin: 8 g/dL — ABNORMAL LOW (ref 11.1–15.9)
MCH: 14.6 pg — ABNORMAL LOW (ref 26.6–33.0)
MCHC: 27.2 g/dL — ABNORMAL LOW (ref 31.5–35.7)
MCV: 54 fL — ABNORMAL LOW (ref 79–97)
Platelets: 330 10*3/uL (ref 150–450)
RBC: 5.49 x10E6/uL — ABNORMAL HIGH (ref 3.77–5.28)
RDW: 24.7 % — ABNORMAL HIGH (ref 11.7–15.4)
WBC: 12.1 10*3/uL — ABNORMAL HIGH (ref 3.4–10.8)

## 2023-01-02 LAB — CMP14+EGFR
ALT: 8 IU/L (ref 0–32)
AST: 9 IU/L (ref 0–40)
Albumin: 4 g/dL (ref 4.0–5.0)
Alkaline Phosphatase: 84 IU/L (ref 44–121)
BUN/Creatinine Ratio: 13 (ref 9–23)
BUN: 9 mg/dL (ref 6–20)
Bilirubin Total: <0.2 mg/dL (ref 0.0–1.2)
CO2: 20 mmol/L (ref 20–29)
Calcium: 9.1 mg/dL (ref 8.7–10.2)
Chloride: 104 mmol/L (ref 96–106)
Creatinine, Ser: 0.67 mg/dL (ref 0.57–1.00)
Globulin, Total: 3.1 g/dL (ref 1.5–4.5)
Glucose: 87 mg/dL (ref 70–99)
Potassium: 4.2 mmol/L (ref 3.5–5.2)
Sodium: 139 mmol/L (ref 134–144)
Total Protein: 7.1 g/dL (ref 6.0–8.5)
eGFR: 123 mL/min/{1.73_m2} (ref >59)

## 2023-01-02 LAB — HEMOGLOBIN A1C
Est. average glucose Bld gHb Est-mCnc: 111 mg/dL
Hgb A1c MFr Bld: 5.5 % (ref 4.8–5.6)

## 2023-01-02 LAB — LIPID PANEL
Chol/HDL Ratio: 2.5 ratio (ref 0.0–4.4)
Cholesterol, Total: 85 mg/dL — ABNORMAL LOW (ref 100–199)
HDL: 34 mg/dL — ABNORMAL LOW (ref >39)
LDL Chol Calc (NIH): 36 mg/dL (ref 0–99)
Triglycerides: 66 mg/dL (ref 0–149)
VLDL Cholesterol Cal: 15 mg/dL (ref 5–40)

## 2023-01-02 LAB — VITAMIN D 25 HYDROXY (VIT D DEFICIENCY, FRACTURES): Vit D, 25-Hydroxy: 29.7 ng/mL — ABNORMAL LOW (ref 30.0–100.0)

## 2023-01-02 MED ORDER — IRON (FERROUS SULFATE) 325 (65 FE) MG PO TABS: 325.0000 mg | ORAL_TABLET | Freq: Every day | ORAL | 0 refills | Status: DC

## 2023-01-02 MED ORDER — VITAMIN D (ERGOCALCIFEROL) 1.25 MG (50000 UNIT) PO CAPS: 50000.0000 [IU] | ORAL_CAPSULE | ORAL | 0 refills | Status: DC

## 2023-01-08 ENCOUNTER — Encounter: Payer: Self-pay | Admitting: Family

## 2023-01-09 ENCOUNTER — Other Ambulatory Visit: Payer: Self-pay | Admitting: Family

## 2023-01-09 DIAGNOSIS — E559 Vitamin D deficiency, unspecified: Secondary | ICD-10-CM

## 2023-01-09 MED ORDER — VITAMIN D (ERGOCALCIFEROL) 1.25 MG (50000 UNIT) PO CAPS: 50000.0000 [IU] | ORAL_CAPSULE | ORAL | 0 refills | Status: AC

## 2023-01-09 NOTE — Telephone Encounter (Signed)
Complete

## 2023-01-19 ENCOUNTER — Ambulatory Visit: Payer: No Typology Code available for payment source | Admitting: Family

## 2023-01-26 ENCOUNTER — Encounter: Payer: No Typology Code available for payment source | Admitting: Hematology and Oncology

## 2023-01-26 ENCOUNTER — Other Ambulatory Visit: Payer: No Typology Code available for payment source

## 2023-01-29 ENCOUNTER — Telehealth: Payer: No Typology Code available for payment source | Admitting: Physician Assistant

## 2023-01-29 ENCOUNTER — Encounter: Payer: No Typology Code available for payment source | Admitting: Physician Assistant

## 2023-01-29 DIAGNOSIS — J208 Acute bronchitis due to other specified organisms: Secondary | ICD-10-CM | POA: Diagnosis not present

## 2023-01-29 DIAGNOSIS — B9689 Other specified bacterial agents as the cause of diseases classified elsewhere: Secondary | ICD-10-CM | POA: Diagnosis not present

## 2023-01-29 DIAGNOSIS — J019 Acute sinusitis, unspecified: Secondary | ICD-10-CM

## 2023-01-29 DIAGNOSIS — U071 COVID-19: Secondary | ICD-10-CM

## 2023-01-29 MED ORDER — FLUTICASONE PROPIONATE 50 MCG/ACT NA SUSP: 2.0000 | Freq: Every day | NASAL | 0 refills | Status: DC

## 2023-01-29 MED ORDER — AMOXICILLIN-POT CLAVULANATE 875-125 MG PO TABS: 1.0000 | ORAL_TABLET | Freq: Two times a day (BID) | ORAL | 0 refills | Status: DC

## 2023-01-29 MED ORDER — PROMETHAZINE-DM 6.25-15 MG/5ML PO SYRP: 5.0000 mL | ORAL_SOLUTION | Freq: Four times a day (QID) | ORAL | 0 refills | Status: DC | PRN

## 2023-01-29 NOTE — Progress Notes (Signed)
   Thank you for the details you included in the comment boxes. Those details are very helpful in determining the best course of treatment for you and help [place] to provide the best care. We recommend that you convert this visit to a video visit in order for the provider to better assess what is going on.  The provider will be able to give you a more accurate diagnosis and treatment plan if we can more freely discuss your symptoms and with the addition of a virtual examination.   If you convert to a video visit, we will bill your insurance (similar to an office visit) and you will not be charged for this e-Visit. You will be able to stay at home and speak with the first available [facility] advanced practice provider. The link to do a video visit is in the drop down Menu tab of your Welcome screen in MyChart.  You will need to schedule the video visit. You can do this through one of two ways:  1) Go into your MyChart App and select the "Menu" button, then select the "Virtual Urgent Care Visit" then proceed scheduling -OR- 2) Go to [email or web address] and select "Get Started" under the Virtual Urgent Care option, select "View all options", then select the "Schedule on your Time" and proceed with scheduling.  Best Regards,  Daiva Nakayama, PA-C   I have spent 5 minutes in review of e-visit questionnaire, review and updating patient chart, medical decision making and response to patient.   [name], PA-C

## 2023-01-29 NOTE — Patient Instructions (Signed)
Claudia Delacruz, thank you for joining [name], PA-C for today's virtual visit.  While this provider is not your primary care provider (PCP), if your PCP is located in our provider database this encounter information will be shared with them immediately following your visit.   A [place] MyChart account gives you access to today's visit and all your visits, tests, and labs performed at [facility] " click here if you don't have a  MyChart account or go to [email or web address]  Consent: (Patient) Claudia Delacruz provided verbal consent for this virtual visit at the beginning of the encounter.  Current Medications:  Current Outpatient Medications:    amoxicillin-clavulanate (AUGMENTIN) 875-125 MG tablet, Take 1 tablet by mouth 2 (two) times daily., Disp: 20 tablet, Rfl: 0   fluticasone (FLONASE) 50 MCG/ACT nasal spray, Place 2 sprays into both nostrils daily., Disp: 16 g, Rfl: 0   promethazine-dextromethorphan (PROMETHAZINE-DM) 6.25-15 MG/5ML syrup, Take 5 mLs by mouth 4 (four) times daily as needed., Disp: 118 mL, Rfl: 0   diphenhydrAMINE (BENADRYL) 25 mg capsule, Take 25-50 mg by mouth every 6 (six) hours as needed (for seasonal allergies)., Disp: , Rfl:    gabapentin (NEURONTIN) 100 MG capsule, Take 2 capsules (200 mg total) by mouth at bedtime. (Patient not taking: Reported on 08/08/2016), Disp: 60 capsule, Rfl: 3   Iron, Ferrous Sulfate, 325 (65 Fe) MG TABS, Take 325 mg by mouth daily., Disp: 90 tablet, Rfl: 0   loperamide (IMODIUM) 2 MG capsule, Take 1 capsule (2 mg total) by mouth 4 (four) times daily as needed for diarrhea or loose stools. (Patient not taking: Reported on 01/01/2023), Disp: 12 capsule, Rfl: 0   metoprolol succinate (TOPROL-XL) 25 MG 24 hr tablet, Take 2 tablets (50 mg total) by mouth daily., Disp: 180 tablet, Rfl: 0   ondansetron (ZOFRAN ODT) 4 MG disintegrating tablet, Take 1 tablet (4 mg total) by mouth every 8 (eight) hours as  needed for nausea or vomiting. (Patient not taking: Reported on 01/01/2023), Disp: 20 tablet, Rfl: 0   Vitamin D, Ergocalciferol, (DRISDOL) 1.25 MG (50000 UNIT) CAPS capsule, Take 1 capsule (50,000 Units total) by mouth every 7 (seven) days for 12 doses., Disp: 12 capsule, Rfl: 0   Medications ordered in this encounter:  Meds ordered this encounter  Medications   amoxicillin-clavulanate (AUGMENTIN) 875-125 MG tablet    Sig: Take 1 tablet by mouth 2 (two) times daily.    Dispense:  20 tablet    Refill:  0    Order Specific Question:   Supervising Provider    Answer:   Merrilee Jansky [[id number]]   fluticasone (FLONASE) 50 MCG/ACT nasal spray    Sig: Place 2 sprays into both nostrils daily.    Dispense:  16 g    Refill:  0    Order Specific Question:   Supervising Provider    Answer:   Merrilee Jansky [id number]   promethazine-dextromethorphan (PROMETHAZINE-DM) 6.25-15 MG/5ML syrup    Sig: Take 5 mLs by mouth 4 (four) times daily as needed.    Dispense:  118 mL    Refill:  0    Order Specific Question:   Supervising Provider    Answer:   Merrilee Jansky [[id number]]     *If you need refills on other medications prior to your next appointment, please contact your pharmacy*  Follow-Up: Call back or seek an in-person evaluation if the symptoms worsen or if the condition fails to improve as anticipated.  [facility] [phone]  Other Instructions  Acute Bronchitis, Adult  Acute bronchitis is when air tubes in the lungs (bronchi) suddenly get swollen. The condition can make it hard for you to breathe. In adults, acute bronchitis usually goes away within 2 weeks. A cough caused by bronchitis may last up to 3 weeks. Smoking, allergies, and asthma can make the condition worse. What are the causes? Germs that cause cold and flu (viruses). The most common cause of this condition is the virus that causes the common cold. Bacteria. Substances that bother (irritate) the  lungs, including: Smoke from cigarettes and other types of tobacco. Dust and pollen. Fumes from chemicals, gases, or burned fuel. Indoor or outdoor air pollution. What increases the risk? A weak body's defense system. This is also called the immune system. Any condition that affects your lungs and breathing, such as asthma. What are the signs or symptoms? A cough. Coughing up clear, yellow, or green mucus. Making high-pitched whistling sounds when you breathe, most often when you breathe out (wheezing). Runny or stuffy nose. Having too much mucus in your lungs (chest congestion). Shortness of breath. Body aches. A sore throat. How is this treated? Acute bronchitis may go away over time without treatment. Your doctor may tell you to: Drink more fluids. This will help thin your mucus so it is easier to cough up. Use a device that gets medicine into your lungs (inhaler). Use a vaporizer or a humidifier. These are machines that add water to the air. This helps with coughing and poor breathing. Take a medicine that thins mucus and helps clear it from your lungs. Take a medicine that prevents or stops coughing. It is not common to take an antibiotic medicine for this condition. Follow these instructions at home:  Take over-the-counter and prescription medicines only as told by your doctor. Use an inhaler, vaporizer, or humidifier as told by your doctor. Take two teaspoons (10 mL) of honey at bedtime. This helps lessen your coughing at night. Drink enough fluid to keep your pee (urine) pale yellow. Do not smoke or use any products that contain nicotine or tobacco. If you need help quitting, ask your doctor. Get a lot of rest. Return to your normal activities when your doctor says that it is safe. Keep all follow-up visits. How is this prevented?  Wash your hands often with soap and water for at least 20 seconds. If you cannot use soap and water, use hand sanitizer. Avoid contact with  people who have cold symptoms. Try not to touch your mouth, nose, or eyes with your hands. Avoid breathing in smoke or chemical fumes. Make sure to get the flu shot every year. Contact a doctor if: Your symptoms do not get better in 2 weeks. You have trouble coughing up the mucus. Your cough keeps you awake at night. You have a fever. Get help right away if: You cough up blood. You have chest pain. You have very bad shortness of breath. You faint or keep feeling like you are going to faint. You have a very bad headache. Your fever or chills get worse. These symptoms may be an emergency. Get help right away. Call your local emergency services (911 in the [place] Do not wait to see if the symptoms will go away. Do not drive yourself to the hospital. Summary Acute bronchitis is when air tubes in the lungs (bronchi) suddenly get swollen. In adults, acute bronchitis usually goes away within 2 weeks. Drink more  fluids. This will help thin your mucus so it is easier to cough up. Take over-the-counter and prescription medicines only as told by your doctor. Contact a doctor if your symptoms do not improve after 2 weeks of treatment. This information is not intended to replace advice given to you by your health care provider. Make sure you discuss any questions you have with your health care provider. Document Revised: 10/20/2020 Document Reviewed: 10/20/2020 Elsevier Patient Education  2024 Elsevier Inc.   Sinus Infection, Adult A sinus infection is soreness and swelling (inflammation) of your sinuses. Sinuses are hollow spaces in the bones around your face. They are located: Around your eyes. In the middle of your forehead. Behind your nose. In your cheekbones. Your sinuses and nasal passages are lined with a fluid called mucus. Mucus drains out of your sinuses. Swelling can trap mucus in your sinuses. This lets germs (bacteria, virus, or fungus) grow, which leads to infection. Most of the  time, this condition is caused by a virus. What are the causes? Allergies. Asthma. Germs. Things that block your nose or sinuses. Growths in the nose (nasal polyps). Chemicals or irritants in the air. A fungus. This is rare. What increases the risk? Having a weak body defense system (immune system). Doing a lot of swimming or diving. Using nasal sprays too much. Smoking. What are the signs or symptoms? The main symptoms of this condition are pain and a feeling of pressure around the sinuses. Other symptoms include: Stuffy nose (congestion). This may make it hard to breathe through your nose. Runny nose (drainage). Soreness, swelling, and warmth in the sinuses. A cough that may get worse at night. Being unable to smell and taste. Mucus that collects in the throat or the back of the nose (postnasal drip). This may cause a sore throat or bad breath. Being very tired (fatigued). A fever. How is this diagnosed? Your symptoms. Your medical history. A physical exam. Tests to find out if your condition is short-term (acute) or long-term (chronic). Your doctor may: Check your nose for growths (polyps). Check your sinuses using a tool that has a light on one end (endoscope). Check for allergies or germs. Do imaging tests, such as an MRI or CT scan. How is this treated? Treatment for this condition depends on the cause and whether it is short-term or long-term. If caused by a virus, your symptoms should go away on their own within 10 days. You may be given medicines to relieve symptoms. They include: Medicines that shrink swollen tissue in the nose. A spray that treats swelling of the nostrils. Rinses that help get rid of thick mucus in your nose (nasal saline washes). Medicines that treat allergies (antihistamines). Over-the-counter pain relievers. If caused by bacteria, your doctor may wait to see if you will get better without treatment. You may be given antibiotic medicine if you  have: A very bad infection. A weak body defense system. If caused by growths in the nose, surgery may be needed. Follow these instructions at home: Medicines Take, use, or apply over-the-counter and prescription medicines only as told by your doctor. These may include nasal sprays. If you were prescribed an antibiotic medicine, take it as told by your doctor. Do not stop taking it even if you start to feel better. Hydrate and humidify  Drink enough water to keep your pee (urine) pale yellow. Use a cool mist humidifier to keep the humidity level in your home above 50%. Breathe in steam for 10-15 minutes,  3-4 times a day, or as told by your doctor. You can do this in the bathroom while a hot shower is running. Try not to spend time in cool or dry air. Rest Rest as much as you can. Sleep with your head raised (elevated). Make sure you get enough sleep each night. General instructions  Put a warm, moist washcloth on your face 3-4 times a day, or as often as told by your doctor. Use nasal saline washes as often as told by your doctor. Wash your hands often with soap and water. If you cannot use soap and water, use hand sanitizer. Do not smoke. Avoid being around people who are smoking (secondhand smoke). Keep all follow-up visits. Contact a doctor if: You have a fever. Your symptoms get worse. Your symptoms do not get better within 10 days. Get help right away if: You have a very bad headache. You cannot stop vomiting. You have very bad pain or swelling around your face or eyes. You have trouble seeing. You feel confused. Your neck is stiff. You have trouble breathing. These symptoms may be an emergency. Get help right away. Call 911. Do not wait to see if the symptoms will go away. Do not drive yourself to the hospital. Summary A sinus infection is swelling of your sinuses. Sinuses are hollow spaces in the bones around your face. This condition is caused by tissues in your nose  that become inflamed or swollen. This traps germs. These can lead to infection. If you were prescribed an antibiotic medicine, take it as told by your doctor. Do not stop taking it even if you start to feel better. Keep all follow-up visits. This information is not intended to replace advice given to you by your health care provider. Make sure you discuss any questions you have with your health care provider. Document Revised: 05/24/2021 Document Reviewed: 05/24/2021 Elsevier Patient Education  2024 Elsevier Inc.    If you have been instructed to have an in-person evaluation today at a local Urgent Care facility, please use the link below. It will take you to a list of all of our available [facility], including address, phone number and hours of operation. Please do not delay care.  [facility]  If you or a family member do not have a primary care provider, use the link below to schedule a visit and establish care. When you choose a [facility] primary care physician or advanced practice provider, you gain a long-term partner in health. Find a Primary Care Provider  Learn more about [facility] in-office and virtual care options: [facility] Get Care Now

## 2023-01-29 NOTE — Progress Notes (Signed)
Virtual Visit Consent   Claudia Delacruz, you are scheduled for a virtual visit with a [facility] provider today. Just as with appointments in the office, your consent must be obtained to participate. Your consent will be active for this visit and any virtual visit you may have with one of our providers in the next 365 days. If you have a MyChart account, a copy of this consent can be sent to you electronically.  As this is a virtual visit, video technology does not allow for your provider to perform a traditional examination. This may limit your provider's ability to fully assess your condition. If your provider identifies any concerns that need to be evaluated in person or the need to arrange testing (such as labs, EKG, etc.), we will make arrangements to do so. Although advances in technology are sophisticated, we cannot ensure that it will always work on either your end or our end. If the connection with a video visit is poor, the visit may have to be switched to a telephone visit. With either a video or telephone visit, we are not always able to ensure that we have a secure connection.  By engaging in this virtual visit, you consent to the provision of healthcare and authorize for your insurance to be billed (if applicable) for the services provided during this visit. Depending on your insurance coverage, you may receive a charge related to this service.  I need to obtain your verbal consent now. Are you willing to proceed with your visit today? Claudia Delacruz has provided verbal consent on 01/29/2023 for a virtual visit (video or telephone). Claudia Loveless, PA-C  Date: 01/29/2023 9:26 AM  Virtual Visit via Video Note   I, Claudia Delacruz, connected with  Claudia Delacruz  ([id number], 03/12/1996) on 01/29/23 at  9:15 AM EDT by a video-enabled telemedicine application and verified that I am speaking with the correct person using two identifiers.  Location: Patient: Virtual Visit  Location Patient: Home Provider: Virtual Visit Location Provider: Home Office   I discussed the limitations of evaluation and management by telemedicine and the availability of in person appointments. The patient expressed understanding and agreed to proceed.    History of Present Illness: Claudia Delacruz is a 27 y.o. who identifies as a female who was assigned female at birth, and is being seen today for Covid 2.  HPI: URI  This is a new problem. The current episode started in the past 7 days (Tested positive for Covid 19 on 01/22/23; symptoms have not improved). The problem has been gradually worsening. Maximum temperature: subjective fevers. Associated symptoms include congestion, coughing (productive of yellow to green sputum), headaches, nausea, rhinorrhea, sinus pain and a sore throat. Pertinent negatives include no chest pain, diarrhea, ear pain, plugged ear sensation, vomiting or wheezing. She has tried acetaminophen, antihistamine, decongestant, increased fluids and sleep for the symptoms. The treatment provided no relief.     Problems:  Patient Active Problem List   Diagnosis Date Noted   Bilateral foot pain 07/26/2015   Pes planus of both feet 07/26/2015   Left hand pain 06/01/2015   SKIN TAG 09/09/2007   KNEE PAIN, CHRONIC 09/09/2007   Anemia 08/30/2006   RHINITIS, ALLERGIC 08/30/2006    Allergies: No Known Allergies Medications:  Current Outpatient Medications:    amoxicillin-clavulanate (AUGMENTIN) 875-125 MG tablet, Take 1 tablet by mouth 2 (two) times daily., Disp: 20 tablet, Rfl: 0   fluticasone (FLONASE) 50 MCG/ACT nasal spray, Place  2 sprays into both nostrils daily., Disp: 16 g, Rfl: 0   promethazine-dextromethorphan (PROMETHAZINE-DM) 6.25-15 MG/5ML syrup, Take 5 mLs by mouth 4 (four) times daily as needed., Disp: 118 mL, Rfl: 0   diphenhydrAMINE (BENADRYL) 25 mg capsule, Take 25-50 mg by mouth every 6 (six) hours as needed (for seasonal allergies)., Disp: , Rfl:     gabapentin (NEURONTIN) 100 MG capsule, Take 2 capsules (200 mg total) by mouth at bedtime. (Patient not taking: Reported on 08/08/2016), Disp: 60 capsule, Rfl: 3   Iron, Ferrous Sulfate, 325 (65 Fe) MG TABS, Take 325 mg by mouth daily., Disp: 90 tablet, Rfl: 0   loperamide (IMODIUM) 2 MG capsule, Take 1 capsule (2 mg total) by mouth 4 (four) times daily as needed for diarrhea or loose stools. (Patient not taking: Reported on 01/01/2023), Disp: 12 capsule, Rfl: 0   metoprolol succinate (TOPROL-XL) 25 MG 24 hr tablet, Take 2 tablets (50 mg total) by mouth daily., Disp: 180 tablet, Rfl: 0   ondansetron (ZOFRAN ODT) 4 MG disintegrating tablet, Take 1 tablet (4 mg total) by mouth every 8 (eight) hours as needed for nausea or vomiting. (Patient not taking: Reported on 01/01/2023), Disp: 20 tablet, Rfl: 0   Vitamin D, Ergocalciferol, (DRISDOL) 1.25 MG (50000 UNIT) CAPS capsule, Take 1 capsule (50,000 Units total) by mouth every 7 (seven) days for 12 doses., Disp: 12 capsule, Rfl: 0  Observations/Objective: Patient is well-developed, well-nourished in no acute distress.  Resting comfortably at home.  Head is normocephalic, atraumatic.  No labored breathing.  Speech is clear and coherent with logical content.  Patient is alert and oriented at baseline.    Assessment and Plan: 1. Acute bacterial sinusitis - amoxicillin-clavulanate (AUGMENTIN) 875-125 MG tablet; Take 1 tablet by mouth 2 (two) times daily.  Dispense: 20 tablet; Refill: 0 - fluticasone (FLONASE) 50 MCG/ACT nasal spray; Place 2 sprays into both nostrils daily.  Dispense: 16 g; Refill: 0 - promethazine-dextromethorphan (PROMETHAZINE-DM) 6.25-15 MG/5ML syrup; Take 5 mLs by mouth 4 (four) times daily as needed.  Dispense: 118 mL; Refill: 0  2. Acute bacterial bronchitis - amoxicillin-clavulanate (AUGMENTIN) 875-125 MG tablet; Take 1 tablet by mouth 2 (two) times daily.  Dispense: 20 tablet; Refill: 0 - fluticasone (FLONASE) 50 MCG/ACT nasal spray;  Place 2 sprays into both nostrils daily.  Dispense: 16 g; Refill: 0 - promethazine-dextromethorphan (PROMETHAZINE-DM) 6.25-15 MG/5ML syrup; Take 5 mLs by mouth 4 (four) times daily as needed.  Dispense: 118 mL; Refill: 0  3. COVID-19  - Worsening symptoms that have not responded to OTC medications.  - Suspect secondary bacterial infections following Covid 19 infection - Will give Augmentin, Promethazine DM cough syrup - Continue allergy medications.  - Steam and humidifier can help - Stay well hydrated and get plenty of rest.  - Seek in person evaluation if no symptom improvement or if symptoms worsen   Follow Up Instructions: I discussed the assessment and treatment plan with the patient. The patient was provided an opportunity to ask questions and all were answered. The patient agreed with the plan and demonstrated an understanding of the instructions.  A copy of instructions were sent to the patient via MyChart unless otherwise noted below.    The patient was advised to call back or seek an in-person evaluation if the symptoms worsen or if the condition fails to improve as anticipated.  Time:  I spent 15 minutes with the patient via telehealth technology discussing the above problems/concerns.    Claudia Loveless, PA-C

## 2023-01-29 NOTE — Progress Notes (Signed)
Duplicate. Disregard.

## 2023-02-28 ENCOUNTER — Encounter: Payer: No Typology Code available for payment source | Admitting: Family

## 2023-03-06 ENCOUNTER — Telehealth: Payer: No Typology Code available for payment source | Admitting: Physician Assistant

## 2023-03-06 DIAGNOSIS — J208 Acute bronchitis due to other specified organisms: Secondary | ICD-10-CM | POA: Diagnosis not present

## 2023-03-06 DIAGNOSIS — B9689 Other specified bacterial agents as the cause of diseases classified elsewhere: Secondary | ICD-10-CM

## 2023-03-06 MED ORDER — BENZONATATE 100 MG PO CAPS: 100.0000 mg | ORAL_CAPSULE | Freq: Three times a day (TID) | ORAL | 0 refills | Status: DC | PRN

## 2023-03-06 MED ORDER — AZITHROMYCIN 250 MG PO TABS: ORAL_TABLET | ORAL | 0 refills | Status: AC

## 2023-03-06 NOTE — Progress Notes (Signed)
E-Visit for Cough  We are sorry that you are not feeling well.  Here is how we plan to help!  Based on your presentation I believe you most likely have A cough due to bacteria.  When patients have a productive cough with a change in color or increased sputum production, we are concerned about bacterial bronchitis.  If left untreated it can progress to pneumonia.  If your symptoms do not improve with your treatment plan it is important that you contact your provider.   I have prescribed Azithromyin 250 mg: two tablets now and then one tablet daily for 4 additonal days    In addition you may use A prescription cough medication called Tessalon Perles 100mg. You may take 1-2 capsules every 8 hours as needed for your cough.  From your responses in the eVisit questionnaire you describe inflammation in the upper respiratory tract which is causing a significant cough.  This is commonly called Bronchitis and has four common causes:   Allergies Viral Infections Acid Reflux Bacterial Infection Allergies, viruses and acid reflux are treated by controlling symptoms or eliminating the cause. An example might be a cough caused by taking certain blood pressure medications. You stop the cough by changing the medication. Another example might be a cough caused by acid reflux. Controlling the reflux helps control the cough.  USE OF BRONCHODILATOR ("RESCUE") INHALERS: There is a risk from using your bronchodilator too frequently.  The risk is that over-reliance on a medication which only relaxes the muscles surrounding the breathing tubes can reduce the effectiveness of medications prescribed to reduce swelling and congestion of the tubes themselves.  Although you feel brief relief from the bronchodilator inhaler, your asthma may actually be worsening with the tubes becoming more swollen and filled with mucus.  This can delay other crucial treatments, such as oral steroid medications. If you need to use a bronchodilator  inhaler daily, several times per day, you should discuss this with your provider.  There are probably better treatments that could be used to keep your asthma under control.     HOME CARE Only take medications as instructed by your medical team. Complete the entire course of an antibiotic. Drink plenty of fluids and get plenty of rest. Avoid close contacts especially the very young and the elderly Cover your mouth if you cough or cough into your sleeve. Always remember to wash your hands A steam or ultrasonic humidifier can help congestion.   GET HELP RIGHT AWAY IF: You develop worsening fever. You become short of breath You cough up blood. Your symptoms persist after you have completed your treatment plan MAKE SURE YOU  Understand these instructions. Will watch your condition. Will get help right away if you are not doing well or get worse.    Thank you for choosing an e-visit.  Your e-visit answers were reviewed by a board certified advanced clinical practitioner to complete your personal care plan. Depending upon the condition, your plan could have included both over the counter or prescription medications.  Please review your pharmacy choice. Make sure the pharmacy is open so you can pick up prescription now. If there is a problem, you may contact your provider through MyChart messaging and have the prescription routed to another pharmacy.  Your safety is important to us. If you have drug allergies check your prescription carefully.   For the next 24 hours you can use MyChart to ask questions about today's visit, request a non-urgent call back, or ask   for a work or school excuse. You will get an email in the next two days asking about your experience. I hope that your e-visit has been valuable and will speed your recovery.  

## 2023-03-06 NOTE — Progress Notes (Signed)
I have spent 5 minutes in review of e-visit questionnaire, review and updating patient chart, medical decision making and response to patient.   William Cody Martin, PA-C    

## 2023-03-12 ENCOUNTER — Telehealth: Payer: No Typology Code available for payment source | Admitting: Physician Assistant

## 2023-03-12 DIAGNOSIS — R051 Acute cough: Secondary | ICD-10-CM

## 2023-03-12 DIAGNOSIS — R058 Other specified cough: Secondary | ICD-10-CM

## 2023-03-12 NOTE — Progress Notes (Signed)
E-Visit for Cough  We are sorry that you are not feeling well.  Here is how we plan to help!  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 This is a chronic cough after a moderate upper respiratory infection that is caused from residual inflammation in the airway from the previous infection.   In addition you may use A non-prescription cough medication called Mucinex DM: take 2 tablets every 12 hours.  From your responses in the eVisit questionnaire you describe inflammation in the upper respiratory tract which is causing a significant cough.  This is commonly called Bronchitis and has four common causes:   Allergies Viral Infections Acid Reflux Bacterial Infection Allergies, viruses and acid reflux are treated by controlling symptoms or eliminating the cause. An example might be a cough caused by taking certain blood pressure medications. You stop the cough by changing the medication. Another example might be a cough caused by acid reflux. Controlling the reflux helps control the cough.  USE OF BRONCHODILATOR ("RESCUE") INHALERS: There is a risk from using your bronchodilator too frequently.  The risk is that over-reliance on a medication which only relaxes the muscles surrounding the breathing tubes can reduce the effectiveness of medications prescribed to reduce swelling and congestion of the tubes themselves.  Although you feel brief relief from the bronchodilator inhaler, your asthma may actually be worsening with the tubes becoming more swollen and filled with mucus.  This can delay other crucial treatments, such as oral steroid medications. If you need to use a bronchodilator inhaler daily, several times per day, you should discuss this with your provider.  There are probably better treatments that could be used to keep your asthma under control.   A work note has been added to Pharmacologist for today.    HOME CARE Only take medications as  instructed by your medical team. Complete the entire course of an antibiotic. Drink plenty of fluids and get plenty of rest. Avoid close contacts especially the very young and the elderly Cover your mouth if you cough or cough into your sleeve. Always remember to wash your hands A steam or ultrasonic humidifier can help congestion.   GET HELP RIGHT AWAY IF: You develop worsening fever. You become short of breath You cough up blood. Your symptoms persist after you have completed your treatment plan MAKE SURE YOU  Understand these instructions. Will watch your condition. Will get help right away if you are not doing well or get worse.    Thank you for choosing an e-visit.  Your e-visit answers were reviewed by a board certified advanced clinical practitioner to complete your personal care plan. Depending upon the condition, your plan could have included both over the counter or prescription medications.  Please review your pharmacy choice. Make sure the pharmacy is open so you can pick up prescription now. If there is a problem, you may contact your provider through [employer] and have the prescription routed to another pharmacy.  Your safety is important to [place]. If you have drug allergies check your prescription carefully.   For the next 24 hours you can use MyChart to ask questions about today's visit, request a non-urgent call back, or ask for a work or school excuse. You will get an email in the next two days asking about your experience. I hope that your e-visit has been valuable and will speed your recovery.  I have spent 5 minutes in review of e-visit questionnaire,  review and updating patient chart, medical decision making and response to patient.   [name], PA-C

## 2023-04-03 ENCOUNTER — Encounter: Payer: No Typology Code available for payment source | Admitting: Family

## 2023-04-03 NOTE — Progress Notes (Signed)
Erroneous encounter-disregard

## 2023-04-17 ENCOUNTER — Encounter: Payer: Self-pay | Admitting: Family

## 2023-04-17 ENCOUNTER — Ambulatory Visit (INDEPENDENT_AMBULATORY_CARE_PROVIDER_SITE_OTHER): Payer: Commercial Managed Care - PPO | Admitting: Family

## 2023-04-17 VITALS — BP 138/88 | HR 82 | Temp 98.4°F | Ht 73.0 in | Wt 331.6 lb

## 2023-04-17 DIAGNOSIS — E559 Vitamin D deficiency, unspecified: Secondary | ICD-10-CM | POA: Diagnosis not present

## 2023-04-17 DIAGNOSIS — I10 Essential (primary) hypertension: Secondary | ICD-10-CM

## 2023-04-17 MED ORDER — METOPROLOL SUCCINATE ER 25 MG PO TB24: 50.0000 mg | ORAL_TABLET | Freq: Every day | ORAL | 0 refills | Status: DC

## 2023-04-17 NOTE — Progress Notes (Signed)
Patient ID: Claudia Delacruz, female    DOB: May 06, 1996  MRN: [id number]  CC: Chronic Conditions Follow-Up  Subjective: Claudia Delacruz is a 27 y.o. female who presents for chronic conditions follow-up.  Her concerns today include:  - Doing well on Metoprolol, no issues/concerns. She does not complain of red flag symptoms such as but not limited to chest pain, shortness of breath, worst headache of life, nausea/vomiting.  - Doing well on Vitamin D, no issues/concerns.   Patient Active Problem List   Diagnosis Date Noted   Bilateral foot pain 07/26/2015   Pes planus of both feet 07/26/2015   Left hand pain 06/01/2015   Hypertrophic and atrophic condition of skin 09/09/2007   KNEE PAIN, CHRONIC 09/09/2007   Anemia 08/30/2006   Allergic rhinitis 08/30/2006     Current Outpatient Medications on File Prior to Visit  Medication Sig Dispense Refill   Iron, Ferrous Sulfate, 325 (65 Fe) MG TABS Take 325 mg by mouth daily. 90 tablet 0   No current facility-administered medications on file prior to visit.    No Known Allergies  Social History   Socioeconomic History   Marital status: Single    Spouse name: Not on file   Number of children: Not on file   Years of education: Not on file   Highest education level: Not on file  Occupational History   Not on file  Tobacco Use   Smoking status: Never   Smokeless tobacco: Never  Substance and Sexual Activity   Alcohol use: No   Drug use: No   Sexual activity: Not on file  Other Topics Concern   Not on file  Social History Narrative   She lives with mother.     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   Social Determinants of Health   Financial Resource Strain: Not on file  Food Insecurity: Not on file  Transportation Needs: Not on file  Physical Activity: Not on file  Stress: Not on file  Social Connections: Not on file  Intimate Partner Violence: Not on file    Family  History  Problem Relation Age of Onset   Healthy Mother    Diabetes Mellitus II Maternal Grandmother    Heart failure Maternal Grandfather    Anemia Sister    Neuropathy Neg Hx     Past Surgical History:  Procedure Laterality Date   NO PAST SURGERIES      ROS: Review of Systems Negative except as stated above  PHYSICAL EXAM: BP 138/88   Pulse 82   Temp 98.4 F (36.9 C) (Oral)   Ht 6\' 1"  (1.854 m)   Wt (!) 331 lb 9.6 oz (150.4 kg)   LMP 03/26/2023 (Exact Date)   SpO2 95%   BMI 43.75 kg/m   Physical Exam HENT:     Head: Normocephalic and atraumatic.     Nose: Nose normal.     Mouth/Throat:     Mouth: Mucous membranes are moist.     Pharynx: Oropharynx is clear.  Eyes:     Extraocular Movements: Extraocular movements intact.     Conjunctiva/sclera: Conjunctivae normal.     Pupils: Pupils are equal, round, and reactive to light.  Cardiovascular:     Rate and Rhythm: Normal rate and regular rhythm.     Pulses: Normal pulses.     Heart sounds: Normal heart sounds.  Pulmonary:     Effort: Pulmonary effort is normal.  Breath sounds: Normal breath sounds.  Musculoskeletal:        General: Normal range of motion.     Cervical back: Normal range of motion and neck supple.  Neurological:     General: No focal deficit present.     Mental Status: She is alert and oriented to person, place, and time.  Psychiatric:        Mood and Affect: Mood normal.        Behavior: Behavior normal.     ASSESSMENT AND PLAN: 1. Primary hypertension - Continue Metoprolol Succinate as prescribed.  - Counseled on blood pressure goal of less than 130/80, low-sodium, DASH diet, medication compliance, and 150 minutes of moderate intensity exercise per week as tolerated. Counseled on medication adherence and adverse effects. - Follow-up with primary provider in 3 months or sooner if needed.  - metoprolol succinate (TOPROL-XL) 25 MG 24 hr tablet; Take 2 tablets (50 mg total) by mouth  daily.  Dispense: 180 tablet; Refill: 0  2. Vitamin D deficiency - Routine screening.  - Vitamin D, 25-hydroxy   Patient was given the opportunity to ask questions.  Patient verbalized understanding of the plan and was able to repeat key elements of the plan. Patient was given clear instructions to go to Emergency Department or return to medical center if symptoms don't improve, worsen, or new problems develop.The patient verbalized understanding.    Requested Prescriptions   Signed Prescriptions Disp Refills   metoprolol succinate (TOPROL-XL) 25 MG 24 hr tablet 180 tablet 0    Sig: Take 2 tablets (50 mg total) by mouth daily.    Return in about 3 months (around 07/18/2023) for Follow-Up or next available chronic conditions.  Rema Fendt, NP

## 2023-04-18 LAB — VITAMIN D 25 HYDROXY (VIT D DEFICIENCY, FRACTURES): Vit D, 25-Hydroxy: 43.3 ng/mL (ref 30.0–100.0)

## 2023-04-30 ENCOUNTER — Other Ambulatory Visit: Payer: Self-pay | Admitting: Family

## 2023-04-30 DIAGNOSIS — I10 Essential (primary) hypertension: Secondary | ICD-10-CM

## 2023-04-30 MED ORDER — METOPROLOL SUCCINATE ER 25 MG PO TB24: 50.0000 mg | ORAL_TABLET | Freq: Every day | ORAL | 0 refills | Status: DC

## 2023-05-09 ENCOUNTER — Ambulatory Visit (INDEPENDENT_AMBULATORY_CARE_PROVIDER_SITE_OTHER): Payer: Commercial Managed Care - PPO | Admitting: Family

## 2023-05-09 VITALS — BP 151/87 | HR 92 | Temp 98.2°F | Ht 73.0 in | Wt 338.8 lb

## 2023-05-09 DIAGNOSIS — I10 Essential (primary) hypertension: Secondary | ICD-10-CM

## 2023-05-09 DIAGNOSIS — Z1329 Encounter for screening for other suspected endocrine disorder: Secondary | ICD-10-CM

## 2023-05-09 DIAGNOSIS — E559 Vitamin D deficiency, unspecified: Secondary | ICD-10-CM

## 2023-05-09 DIAGNOSIS — Z130 Encounter for screening for diseases of the blood and blood-forming organs and certain disorders involving the immune mechanism: Secondary | ICD-10-CM

## 2023-05-09 DIAGNOSIS — D509 Iron deficiency anemia, unspecified: Secondary | ICD-10-CM | POA: Diagnosis not present

## 2023-05-09 DIAGNOSIS — Z114 Encounter for screening for human immunodeficiency virus [HIV]: Secondary | ICD-10-CM

## 2023-05-09 DIAGNOSIS — Z0000 Encounter for general adult medical examination without abnormal findings: Secondary | ICD-10-CM

## 2023-05-09 DIAGNOSIS — Z124 Encounter for screening for malignant neoplasm of cervix: Secondary | ICD-10-CM

## 2023-05-09 DIAGNOSIS — Z1159 Encounter for screening for other viral diseases: Secondary | ICD-10-CM

## 2023-05-09 MED ORDER — METOPROLOL SUCCINATE ER 100 MG PO TB24: 150.0000 mg | ORAL_TABLET | Freq: Every day | ORAL | 0 refills | Status: DC

## 2023-05-09 NOTE — Progress Notes (Signed)
Patient ID: Claudia Delacruz, female    DOB: 1995/12/27  MRN: [id number]  CC: Annual Exam   Subjective: Claudia Delacruz is a 27 y.o. female who presents for annual exam.   Her concerns today include:  - Doing well on Metoprolol Succinate, no issues/concerns. She does not complain of red flag symptoms such as but not limited to chest pain, shortness of breath, worst headache of life, nausea/vomiting.  - Request referral to Gynecology for women's health maintenance/screenings.   Patient Active Problem List   Diagnosis Date Noted   Bilateral foot pain 07/26/2015   Pes planus of both feet 07/26/2015   Left hand pain 06/01/2015   Hypertrophic and atrophic condition of skin 09/09/2007   KNEE PAIN, CHRONIC 09/09/2007   Anemia 08/30/2006   Allergic rhinitis 08/30/2006     Current Outpatient Medications on File Prior to Visit  Medication Sig Dispense Refill   Iron, Ferrous Sulfate, 325 (65 Fe) MG TABS Take 325 mg by mouth daily. 90 tablet 0   No current facility-administered medications on file prior to visit.    No Known Allergies  Social History   Socioeconomic History   Marital status: Single    Spouse name: Not on file   Number of children: Not on file   Years of education: Not on file   Highest education level: Associate degree: academic program  Occupational History   Not on file  Tobacco Use   Smoking status: Never   Smokeless tobacco: Never  Substance and Sexual Activity   Alcohol use: No   Drug use: No   Sexual activity: Not on file  Other Topics Concern   Not on file  Social History Narrative   She lives with mother.     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   Social Determinants of Health   Financial Resource Strain: Low Risk  (05/09/2023)   Overall Financial Resource Strain (CARDIA)    Difficulty of Paying Living Expenses: Not hard at all  Food Insecurity: No Food Insecurity (05/09/2023)   Hunger Vital Sign     Worried About Running Out of Food in the Last Year: Never true    Ran Out of Food in the Last Year: Never true  Transportation Needs: No Transportation Needs (05/09/2023)   PRAPARE - Administrator, Civil Service (Medical): No    Lack of Transportation (Non-Medical): No  Physical Activity: Insufficiently Active (05/09/2023)   Exercise Vital Sign    Days of Exercise per Week: 2 days    Minutes of Exercise per Session: 60 min  Stress: No Stress Concern Present (05/09/2023)   [employer] of Occupational Health - Occupational Stress Questionnaire    Feeling of Stress : Only a little  Social Connections: Socially Isolated (05/09/2023)   Social Connection and Isolation Panel [NHANES]    Frequency of Communication with Friends and Family: More than three times a week    Frequency of Social Gatherings with Friends and Family: Once a week    Attends Religious Services: Never    Database administrator or Organizations: No    Attends Engineer, structural: Not on file    Marital Status: Never married  Catering manager Violence: Not on file    Family History  Problem Relation Age of Onset   Healthy Mother    Diabetes Mellitus II Maternal Grandmother    Heart failure Maternal Grandfather    Anemia Sister  Neuropathy Neg Hx     Past Surgical History:  Procedure Laterality Date   NO PAST SURGERIES      ROS: Review of Systems Negative except as stated above  PHYSICAL EXAM: BP (!) 151/87   Pulse 92   Temp 98.2 F (36.8 C) (Oral)   Ht 6\' 1"  (1.854 m)   Wt (!) 338 lb 12.8 oz (153.7 kg)   LMP 04/20/2023 (Exact Date)   SpO2 92%   BMI 44.70 kg/m   Physical Exam HENT:     Head: Normocephalic and atraumatic.     Right Ear: Tympanic membrane, ear canal and external ear normal.     Left Ear: Tympanic membrane, ear canal and external ear normal.     Nose: Nose normal.     Mouth/Throat:     Mouth: Mucous membranes are moist.     Pharynx: Oropharynx is  clear.  Eyes:     Extraocular Movements: Extraocular movements intact.     Conjunctiva/sclera: Conjunctivae normal.     Pupils: Pupils are equal, round, and reactive to light.  Neck:     Thyroid: No thyroid mass, thyromegaly or thyroid tenderness.  Cardiovascular:     Rate and Rhythm: Normal rate and regular rhythm.     Pulses: Normal pulses.     Heart sounds: Normal heart sounds.  Pulmonary:     Effort: Pulmonary effort is normal.     Breath sounds: Normal breath sounds.  Chest:     Comments: Patient declined.  Abdominal:     General: Bowel sounds are normal.     Palpations: Abdomen is soft.  Genitourinary:    Comments: Patient declined.  Musculoskeletal:        General: Normal range of motion.     Right shoulder: Normal.     Left shoulder: Normal.     Right upper arm: Normal.     Left upper arm: Normal.     Right elbow: Normal.     Left elbow: Normal.     Right forearm: Normal.     Left forearm: Normal.     Right wrist: Normal.     Left wrist: Normal.     Right hand: Normal.     Left hand: Normal.     Cervical back: Normal, normal range of motion and neck supple.     Thoracic back: Normal.     Lumbar back: Normal.     Right hip: Normal.     Left hip: Normal.     Right upper leg: Normal.     Left upper leg: Normal.     Right knee: Normal.     Left knee: Normal.     Right lower leg: Normal.     Left lower leg: Normal.     Right ankle: Normal.     Left ankle: Normal.     Right foot: Normal.     Left foot: Normal.  Skin:    General: Skin is warm and dry.     Capillary Refill: Capillary refill takes less than 2 seconds.  Neurological:     General: No focal deficit present.     Mental Status: She is alert and oriented to person, place, and time.  Psychiatric:        Mood and Affect: Mood normal.        Behavior: Behavior normal.     ASSESSMENT AND PLAN: 1. Annual physical exam - Counseled on 150 minutes of exercise per week as tolerated, healthy eating  (  including decreased daily intake of saturated fats, cholesterol, added sugars, sodium), STI prevention, and routine healthcare maintenance.  2. Screening for deficiency anemia 3. Iron deficiency anemia, unspecified iron deficiency anemia type - Routine screening.  - CBC  4. Thyroid disorder screen - Routine screening.  - TSH  5. Encounter for screening for HIV - Routine screening.  - HIV antibody (with reflex)  6. Need for hepatitis C screening test - Routine screening.  - Hepatitis C Antibody  7. Vitamin D deficiency - Routine screening.  - Vitamin D, 25-hydroxy  8. Pap smear for cervical cancer screening - Referral to Gynecology for evaluation/management.  - Ambulatory referral to Gynecology  9. Primary hypertension - Blood pressure not at goal during today's visit. Patient asymptomatic without chest pressure, chest pain, palpitations, shortness of breath, worst headache of life, and any additional red flag symptoms. - Increase Metoprolol Succinate from 100 mg daily to 150 mg daily.  - Counseled on blood pressure goal of less than 130/80, low-sodium, DASH diet, medication compliance, and 150 minutes of moderate intensity exercise per week as tolerated. Counseled on medication adherence and adverse effects. - Follow-up with primary provider in 2 weeks or sooner if needed for blood pressure check. - metoprolol succinate (TOPROL-XL) 100 MG 24 hr tablet; Take 1.5 tablets (150 mg total) by mouth daily.  Dispense: 135 tablet; Refill: 0   Patient was given the opportunity to ask questions.  Patient verbalized understanding of the plan and was able to repeat key elements of the plan. Patient was given clear instructions to go to Emergency Department or return to medical center if symptoms don't improve, worsen, or new problems develop.The patient verbalized understanding.   Orders Placed This Encounter  Procedures   Vitamin D, 25-hydroxy   CBC   TSH   HIV antibody (with reflex)    Hepatitis C Antibody   Ambulatory referral to Gynecology     Requested Prescriptions   Signed Prescriptions Disp Refills   metoprolol succinate (TOPROL-XL) 100 MG 24 hr tablet 135 tablet 0    Sig: Take 1.5 tablets (150 mg total) by mouth daily.    Return in about 1 year (around 05/08/2024) for Physical per patient preference and 2 weeks blood pressure check.  Rema Fendt, NP

## 2023-05-09 NOTE — Progress Notes (Signed)
Patient doesn't want a pap smear.  Patient states no concerns to discuss  Declined Flu vaccine

## 2023-05-10 LAB — VITAMIN D 25 HYDROXY (VIT D DEFICIENCY, FRACTURES): Vit D, 25-Hydroxy: 38.3 ng/mL (ref 30.0–100.0)

## 2023-05-10 LAB — CBC
Hematocrit: 35.2 % (ref 34.0–46.6)
Hemoglobin: 10.4 g/dL — ABNORMAL LOW (ref 11.1–15.9)
MCH: 18.3 pg — ABNORMAL LOW (ref 26.6–33.0)
MCHC: 29.5 g/dL — ABNORMAL LOW (ref 31.5–35.7)
MCV: 62 fL — ABNORMAL LOW (ref 79–97)
Platelets: 394 10*3/uL (ref 150–450)
RBC: 5.69 x10E6/uL — ABNORMAL HIGH (ref 3.77–5.28)
RDW: 22.9 % — ABNORMAL HIGH (ref 11.7–15.4)
WBC: 10.5 10*3/uL (ref 3.4–10.8)

## 2023-05-10 LAB — HEPATITIS C ANTIBODY: Hep C Virus Ab: NONREACTIVE

## 2023-05-10 LAB — TSH: TSH: 1.53 u[IU]/mL (ref 0.450–4.500)

## 2023-05-10 LAB — HIV ANTIBODY (ROUTINE TESTING W REFLEX): HIV Screen 4th Generation wRfx: NONREACTIVE

## 2023-05-11 ENCOUNTER — Other Ambulatory Visit: Payer: Self-pay | Admitting: Family

## 2023-05-11 DIAGNOSIS — D509 Iron deficiency anemia, unspecified: Secondary | ICD-10-CM

## 2023-05-11 MED ORDER — IRON (FERROUS SULFATE) 325 (65 FE) MG PO TABS: 325.0000 mg | ORAL_TABLET | Freq: Every day | ORAL | 0 refills | Status: AC

## 2023-05-23 ENCOUNTER — Encounter (INDEPENDENT_AMBULATORY_CARE_PROVIDER_SITE_OTHER): Payer: Commercial Managed Care - PPO | Admitting: Family

## 2023-05-23 ENCOUNTER — Telehealth: Payer: Self-pay | Admitting: Family

## 2023-05-23 NOTE — Telephone Encounter (Signed)
Called pt and left vm to call office back to reschedule missed appt

## 2023-05-23 NOTE — Progress Notes (Signed)
Erroneous encounter-disregard

## 2023-06-10 NOTE — Progress Notes (Unsigned)
[facility]   [phone] [phone]   Clinic New consult Note   Patient Care Team: Rema Fendt, NP as PCP - General (Nurse Practitioner) 06/11/2023  CHIEF COMPLAINTS/PURPOSE OF CONSULTATION:  Anemia    HISTORY OF PRESENTING ILLNESS:  Claudia Delacruz 27 y.o. female is here because of iron deficiency anemia. She states that she has had anemia for as long as she can remember.  Going back to 2018, her hemoglobin has been persistently around 10.  In July 2024, her hemoglobin did drop to 8.0.  She was started on oral iron.  Takes this daily.  And recheck of her blood count showed improvement of hemoglobin to 10.4.  Iron studies were not pursued.  She was referred to hematology.  The patient reports that her father and sister may also be anemic.  The patient denies any history of cancer or blood disorders.  She states she has never been able to donate blood due to anemia. She states that her menstrual periods last about 5 days.  She does have heavier menstrual flows on days 3 and 4 of her cycle.  She reports changing a tampon every 3-4 hours on those heavy days.  She denies presence of blood in her stool.  She denies epistaxis.  She denies hematemesis.  She denies unusual bruising or bleeding. She does have a relatively new diagnosis of hypertension.  She is on metoprolol.  This generally controls her blood pressure well.  She tolerates this medication without negative side effects. The patient states she is single.  She lives with her mother.  She works full-time at [employer].  She does not smoke.  She does not drink alcohol.  She has no drug use history.  She does not have children.  She has never been pregnant. She denies chest pain, chest pressure, or shortness of breath. She denies headaches or visual disturbances. She denies abdominal pain, nausea, vomiting, or changes in bowel or bladder habits.    She was found to have abnormal CBC from  01/2023.  Hemoglobin was 8.0.  Recheck 05/2023 hemoglobin improved to 10.4.  Review of labs shows chronic anemia with hemoglobin approximately 10. She denies recent chest pain on exertion, shortness of breath on minimal exertion, pre-syncopal episodes, or palpitations. She had not noticed any recent bleeding such as epistaxis, hematuria or hematochezia The patient denies over the counter NSAID ingestion. She is not  on antiplatelets agents. Her last colonoscopy was n/a due to age.  She had no prior history or diagnosis of cancer. Her age appropriate screening programs are up-to-date. She denies any pica and eats a variety of diet. She never donated blood or received blood transfusion The patient was prescribed oral iron supplements and she takes this daily. She did have improvement in Hgb 3 to 4 months after starting oral iron supplements.   REVIEW OF SYSTEMS:   Constitutional: Denies fevers, chills or abnormal night sweats Eyes: Denies blurriness of vision, double vision or watery eyes Ears, nose, mouth, throat, and face: Denies mucositis or sore throat Respiratory: Denies cough, dyspnea or wheezes Cardiovascular: Denies palpitation, chest discomfort or lower extremity swelling Gastrointestinal:  Denies nausea, heartburn or change in bowel habits Skin: Denies abnormal skin rashes Lymphatics: Denies new lymphadenopathy or easy bruising Neurological:Denies numbness, tingling or new weaknesses Behavioral/Psych: Mood is stable, no new changes   All other systems were reviewed with the patient and are negative.   MEDICAL HISTORY:  Past Medical History:  Diagnosis Date   Hypertension    Vitamin D deficiency     SURGICAL HISTORY: Past Surgical History:  Procedure Laterality Date   NO PAST SURGERIES      SOCIAL HISTORY: Social History   Socioeconomic History   Marital status: Single    Spouse name: Not on file   Number of children: Not on file   Years of education: Not on file    Highest education level: Associate degree: academic program  Occupational History   Not on file  Tobacco Use   Smoking status: Never   Smokeless tobacco: Never  Substance and Sexual Activity   Alcohol use: No   Drug use: No   Sexual activity: Not on file  Other Topics Concern   Not on file  Social History Narrative   She lives with mother.     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   Social Determinants of Health   Financial Resource Strain: Low Risk  (05/09/2023)   Overall Financial Resource Strain (CARDIA)    Difficulty of Paying Living Expenses: Not hard at all  Food Insecurity: No Food Insecurity (05/09/2023)   Hunger Vital Sign    Worried About Running Out of Food in the Last Year: Never true    Ran Out of Food in the Last Year: Never true  Transportation Needs: No Transportation Needs (05/09/2023)   PRAPARE - Administrator, Civil Service (Medical): No    Lack of Transportation (Non-Medical): No  Physical Activity: Insufficiently Active (05/09/2023)   Exercise Vital Sign    Days of Exercise per Week: 2 days    Minutes of Exercise per Session: 60 min  Stress: No Stress Concern Present (05/09/2023)   [employer] of Occupational Health - Occupational Stress Questionnaire    Feeling of Stress : Only a little  Social Connections: Socially Isolated (05/09/2023)   Social Connection and Isolation Panel [NHANES]    Frequency of Communication with Friends and Family: More than three times a week    Frequency of Social Gatherings with Friends and Family: Once a week    Attends Religious Services: Never    Database administrator or Organizations: No    Attends Engineer, structural: Not on file    Marital Status: Never married  Catering manager Violence: Not on file    FAMILY HISTORY: Family History  Problem Relation Age of Onset   Healthy Mother    Diabetes Mellitus II Maternal Grandmother    Heart failure  Maternal Grandfather    Anemia Sister    Neuropathy Neg Hx     ALLERGIES:  has No Known Allergies.  MEDICATIONS:  Current Outpatient Medications  Medication Sig Dispense Refill   Iron, Ferrous Sulfate, 325 (65 Fe) MG TABS Take 325 mg by mouth daily. 90 tablet 0   metoprolol succinate (TOPROL-XL) 100 MG 24 hr tablet Take 1.5 tablets (150 mg total) by mouth daily. 135 tablet 0   No current facility-administered medications for this visit.    PHYSICAL EXAMINATION: ECOG PERFORMANCE STATUS: 0 - Asymptomatic  Vitals:   06/11/23 1411  BP: 139/86  Pulse: 79  Resp: 17  Temp: 97.8 F (36.6 C)  SpO2: 100%   Filed Weights   06/11/23 1411  Weight: (!) 339 lb 3.2 oz (153.9 kg)    GENERAL:alert, no distress and comfortable SKIN: skin color, texture, turgor are normal, no rashes or significant lesions EYES: normal, conjunctiva are pink and  non-injected, sclera clear OROPHARYNX:no exudate, no erythema and lips, buccal mucosa, and tongue normal  NECK: supple, thyroid normal size, non-tender, without nodularity LYMPH:  no palpable lymphadenopathy in the cervical, axillary or inguinal LUNGS: clear to auscultation and percussion with normal breathing effort HEART: regular rate & rhythm and no murmurs and no lower extremity edema ABDOMEN:abdomen soft, non-tender and normal bowel sounds Musculoskeletal:no cyanosis of digits and no clubbing  PSYCH: alert & oriented x 3 with fluent speech NEURO: no focal motor/sensory deficits  LABORATORY DATA:  I have reviewed the data as listed    Latest Ref Rng & Units 05/09/2023    2:55 PM 01/01/2023   12:00 AM 08/08/2016   11:55 AM  CBC  WBC 3.4 - 10.8 x10E3/uL 10.5  12.1  5.8   Hemoglobin 11.1 - 15.9 g/dL 16.1  8.0  09.6   Hematocrit 34.0 - 46.6 % 35.2  29.4  32.5   Platelets 150 - 450 x10E3/uL 394  330  203        Latest Ref Rng & Units 01/01/2023   12:00 AM 08/08/2016   11:55 AM 06/01/2015   10:22 AM  CMP  Glucose 70 - 99 mg/dL 87  045  99    BUN 6 - 20 mg/dL 9  7  9    Creatinine 0.57 - 1.00 mg/dL 4.09  8.11  9.14   Sodium 134 - 144 mmol/L 139  141  139   Potassium 3.5 - 5.2 mmol/L 4.2  3.7  4.2   Chloride 96 - 106 mmol/L 104  106  103   CO2 20 - 29 mmol/L 20  24  25    Calcium 8.7 - 10.2 mg/dL 9.1  8.8  9.4   Total Protein 6.0 - 8.5 g/dL 7.1  7.9  7.5   Total Bilirubin 0.0 - 1.2 mg/dL <7.8  0.7  0.3   Alkaline Phos 44 - 121 IU/L 84  68  72   AST 0 - 40 IU/L 9  45  25   ALT 0 - 32 IU/L 8  23  21     Other iron deficiency anemia Assessment & Plan: Going back to 2018, her hemoglobin has been persistently around 10.  In July 2024, her hemoglobin did drop to 8.0.  She was started on oral iron.  Takes this daily.  And recheck of her blood count showed improvement of hemoglobin to 10.4.  Iron studies were not pursued.  She was referred to hematology.  The patient reports that her father and sister may also be anemic.  The patient denies any history of cancer or blood disorders.  She states she has never been able to donate blood due to anemia. She states that her menstrual periods last about 5 days.  She does have heavier menstrual flows on days 3 and 4 of her cycle.  She reports changing a tampon every 3-4 hours on those heavy days.  She denies presence of blood in her stool.  She denies epistaxis.  She denies hematemesis.  She denies unusual bruising or bleeding. Discussed with patient the likely causes of her iron deficiency.  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  Will run a test called hemoglobin fractionation cascade in addition to blood count, iron studies, and B12 level.  If this test for beta thalassemia is negative, we will run additional testing for alpha thalassemia.  We discussed the possibility of IV iron treatments.  The patient is open and  agreeable to this as a plan.  Will contact her with lab results and plan for further treatment as indicated.  Orders: -     CBC with  Differential (Cancer Center Only); Future -     Iron and Iron Binding Capacity ([facility] only); Future -     Ferritin; Future -     Vitamin B12; Future -     Retic Panel; Future -     Hgb Fractionation Cascade; Future       All questions were answered. The patient knows to call the clinic with any problems, questions or concerns. The total time spent in the appointment was 30 minutes.     Carlean Jews, NP 06/11/2023 3:38 PM   Addendum I have seen the patient, examined her. I agree with the assessment and and plan and have edited the notes.   Patient is a 27 year old female presented with chronic anemia since childhood.  She has significant microcytosis with MCV 50-70, concerning for thalassemia.  She has menorrhagia, likely iron deficient anemia 2.  Will check her CBC, reticulocyte count, iron study, and hemoglobin electrophoresis.  If hemoglobin electrophoresis is normal, will order alpha thalassemia genetic testing.  She is currently on oral iron, will check to see if she needs IV iron.  We discussed iron rich food.  All questions were answered.  Plan to monitor her blood counts and iron study every 3 months, and see her back in 6 months.  I spent a total of 30 minutes for her visit today, more than 50% time on face-to-face counseling.  Malachy Mood MD 06/11/2023

## 2023-06-11 ENCOUNTER — Inpatient Hospital Stay: Payer: Commercial Managed Care - PPO | Attending: Nurse Practitioner | Admitting: Nurse Practitioner

## 2023-06-11 ENCOUNTER — Encounter: Payer: Self-pay | Admitting: Nurse Practitioner

## 2023-06-11 ENCOUNTER — Inpatient Hospital Stay: Payer: Commercial Managed Care - PPO

## 2023-06-11 VITALS — BP 139/86 | HR 79 | Temp 97.8°F | Resp 17 | Wt 339.2 lb

## 2023-06-11 DIAGNOSIS — N920 Excessive and frequent menstruation with regular cycle: Secondary | ICD-10-CM | POA: Diagnosis not present

## 2023-06-11 DIAGNOSIS — Z79899 Other long term (current) drug therapy: Secondary | ICD-10-CM | POA: Diagnosis not present

## 2023-06-11 DIAGNOSIS — D508 Other iron deficiency anemias: Secondary | ICD-10-CM

## 2023-06-11 DIAGNOSIS — I10 Essential (primary) hypertension: Secondary | ICD-10-CM

## 2023-06-11 DIAGNOSIS — D509 Iron deficiency anemia, unspecified: Secondary | ICD-10-CM

## 2023-06-11 LAB — CBC WITH DIFFERENTIAL (CANCER CENTER ONLY)
Abs Immature Granulocytes: 0.02 10*3/uL (ref 0.00–0.07)
Basophils Absolute: 0 10*3/uL (ref 0.0–0.1)
Basophils Relative: 0 %
Eosinophils Absolute: 0 10*3/uL (ref 0.0–0.5)
Eosinophils Relative: 0 %
HCT: 33.5 % — ABNORMAL LOW (ref 36.0–46.0)
Hemoglobin: 10.8 g/dL — ABNORMAL LOW (ref 12.0–15.0)
Immature Granulocytes: 0 %
Lymphocytes Relative: 39 %
Lymphs Abs: 4.2 10*3/uL — ABNORMAL HIGH (ref 0.7–4.0)
MCH: 19.4 pg — ABNORMAL LOW (ref 26.0–34.0)
MCHC: 32.2 g/dL (ref 30.0–36.0)
MCV: 60 fL — ABNORMAL LOW (ref 80.0–100.0)
Monocytes Absolute: 0.9 10*3/uL (ref 0.1–1.0)
Monocytes Relative: 8 %
Neutro Abs: 5.6 10*3/uL (ref 1.7–7.7)
Neutrophils Relative %: 53 %
Platelet Count: 344 10*3/uL (ref 150–400)
RBC: 5.58 MIL/uL — ABNORMAL HIGH (ref 3.87–5.11)
RDW: 22.8 % — ABNORMAL HIGH (ref 11.5–15.5)
WBC Count: 10.8 10*3/uL — ABNORMAL HIGH (ref 4.0–10.5)
nRBC: 0 % (ref 0.0–0.2)

## 2023-06-11 LAB — RETIC PANEL
Immature Retic Fract: 12.6 % (ref 2.3–15.9)
RBC.: 5.58 MIL/uL — ABNORMAL HIGH (ref 3.87–5.11)
Retic Count, Absolute: 47.4 10*3/uL (ref 19.0–186.0)
Retic Ct Pct: 0.9 % (ref 0.4–3.1)
Reticulocyte Hemoglobin: 24.5 pg — ABNORMAL LOW (ref >27.9)

## 2023-06-11 LAB — VITAMIN B12: Vitamin B-12: 276 pg/mL (ref 180–914)

## 2023-06-11 NOTE — Assessment & Plan Note (Addendum)
Going back to 2018, her hemoglobin has been persistently around 10.  In July 2024, her hemoglobin did drop to 8.0.  She was started on oral iron.  Takes this daily.  And recheck of her blood count showed improvement of hemoglobin to 10.4.  Iron studies were not pursued.  She was referred to hematology.  The patient reports that her father and sister may also be anemic.  The patient denies any history of cancer or blood disorders.  She states she has never been able to donate blood due to anemia. She states that her menstrual periods last about 5 days.  She does have heavier menstrual flows on days 3 and 4 of her cycle.  She reports changing a tampon every 3-4 hours on those heavy days.  She denies presence of blood in her stool.  She denies epistaxis.  She denies hematemesis.  She denies unusual bruising or bleeding. Discussed with patient the likely causes of her iron deficiency.  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  Will run a test called hemoglobin fractionation cascade in addition to blood count, iron studies, and B12 level.  If this test for beta thalassemia is negative, we will run additional testing for alpha thalassemia.  We discussed the possibility of IV iron treatments.  The patient is open and agreeable to this as a plan.  Will contact her with lab results and plan for further treatment as indicated.

## 2023-06-12 ENCOUNTER — Encounter: Payer: Self-pay | Admitting: Nurse Practitioner

## 2023-06-12 LAB — IRON AND IRON BINDING CAPACITY (CC-WL,HP ONLY)
Iron: 46 ug/dL (ref 28–170)
Saturation Ratios: 11 % (ref 10.4–31.8)
TIBC: 427 ug/dL (ref 250–450)
UIBC: 381 ug/dL (ref 148–442)

## 2023-06-12 LAB — FERRITIN: Ferritin: 13 ng/mL (ref 11–307)

## 2023-06-15 LAB — HGB FRACTIONATION BY HPLC
Hgb A2: 3.1 % (ref 1.8–3.2)
Hgb A: 71 % — ABNORMAL LOW (ref 96.4–98.8)
Hgb C: 25.9 % — ABNORMAL HIGH
Hgb E: 0 %
Hgb F: 0 % (ref 0.0–2.0)
Hgb S: 0 %
Hgb Variant: 0 %

## 2023-06-15 LAB — HGB FRACTIONATION CASCADE

## 2023-07-18 ENCOUNTER — Telehealth: Payer: Self-pay | Admitting: Family

## 2023-07-18 ENCOUNTER — Encounter: Payer: Commercial Managed Care - PPO | Admitting: Family

## 2023-07-18 NOTE — Progress Notes (Signed)
 Erroneous encounter-disregard

## 2023-07-18 NOTE — Telephone Encounter (Signed)
 Pt no-showed appt scheduled for today; requested appt via mychart. Pt was scheduled for 01/17 with Dr. Elvan Hamel

## 2023-07-20 ENCOUNTER — Ambulatory Visit: Payer: Commercial Managed Care - PPO | Admitting: Family Medicine

## 2023-08-13 ENCOUNTER — Encounter: Payer: Self-pay | Admitting: Family Medicine

## 2023-08-13 ENCOUNTER — Ambulatory Visit (INDEPENDENT_AMBULATORY_CARE_PROVIDER_SITE_OTHER): Payer: Commercial Managed Care - PPO | Admitting: Family Medicine

## 2023-08-13 VITALS — BP 149/82 | HR 75 | Temp 97.9°F | Resp 16 | Ht 73.0 in | Wt 347.8 lb

## 2023-08-13 DIAGNOSIS — I10 Essential (primary) hypertension: Secondary | ICD-10-CM | POA: Diagnosis not present

## 2023-08-13 DIAGNOSIS — E66813 Obesity, class 3: Secondary | ICD-10-CM

## 2023-08-13 DIAGNOSIS — Z6841 Body Mass Index (BMI) 40.0 and over, adult: Secondary | ICD-10-CM | POA: Diagnosis not present

## 2023-08-13 MED ORDER — METOPROLOL SUCCINATE ER 100 MG PO TB24: 150.0000 mg | ORAL_TABLET | Freq: Every day | ORAL | 0 refills | Status: DC

## 2023-08-13 MED ORDER — HYDROCHLOROTHIAZIDE 25 MG PO TABS: 25.0000 mg | ORAL_TABLET | Freq: Every day | ORAL | 0 refills | Status: DC

## 2023-08-13 NOTE — Progress Notes (Signed)
Established Patient Office Visit  Subjective    Patient ID: Claudia Delacruz, female    DOB: July 19, 1995  Age: 28 y.o. MRN: [id number]  CC:  Chief Complaint  Patient presents with   Hypertension    HPI Claudia Delacruz presents for follow up of hypertension. Reports med compliance. Denies acute complaints.   Outpatient Encounter Medications as of 08/13/2023  Medication Sig   hydrochlorothiazide (HYDRODIURIL) 25 MG tablet Take 1 tablet (25 mg total) by mouth daily.   Iron, Ferrous Sulfate, 325 (65 Fe) MG TABS Take 325 mg by mouth daily.   metoprolol succinate (TOPROL-XL) 100 MG 24 hr tablet Take 1.5 tablets (150 mg total) by mouth daily.   [DISCONTINUED] metoprolol succinate (TOPROL-XL) 100 MG 24 hr tablet Take 1.5 tablets (150 mg total) by mouth daily.   No facility-administered encounter medications on file as of 08/13/2023.    Past Medical History:  Diagnosis Date   Hypertension    Vitamin D deficiency     Past Surgical History:  Procedure Laterality Date   NO PAST SURGERIES      Family History  Problem Relation Age of Onset   Healthy Mother    Diabetes Mellitus II Maternal Grandmother    Heart failure Maternal Grandfather    Anemia Sister    Neuropathy Neg Hx     Social History   Socioeconomic History   Marital status: Single    Spouse name: Not on file   Number of children: Not on file   Years of education: Not on file   Highest education level: Associate degree: academic program  Occupational History   Not on file  Tobacco Use   Smoking status: Never   Smokeless tobacco: Never  Substance and Sexual Activity   Alcohol use: No   Drug use: No   Sexual activity: Not on file  Other Topics Concern   Not on file  Social History Narrative   She lives with mother.     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   Social Drivers of Corporate investment banker Strain: Low Risk  (05/09/2023)   Overall Financial  Resource Strain (CARDIA)    Difficulty of Paying Living Expenses: Not hard at all  Food Insecurity: No Food Insecurity (05/09/2023)   Hunger Vital Sign    Worried About Running Out of Food in the Last Year: Never true    Ran Out of Food in the Last Year: Never true  Transportation Needs: No Transportation Needs (05/09/2023)   PRAPARE - Administrator, Civil Service (Medical): No    Lack of Transportation (Non-Medical): No  Physical Activity: Insufficiently Active (05/09/2023)   Exercise Vital Sign    Days of Exercise per Week: 2 days    Minutes of Exercise per Session: 60 min  Stress: No Stress Concern Present (05/09/2023)   [employer] of Occupational Health - Occupational Stress Questionnaire    Feeling of Stress : Only a little  Social Connections: Socially Isolated (05/09/2023)   Social Connection and Isolation Panel [NHANES]    Frequency of Communication with Friends and Family: More than three times a week    Frequency of Social Gatherings with Friends and Family: Once a week    Attends Religious Services: Never    Database administrator or Organizations: No    Attends Engineer, structural: Not on file    Marital Status: Never married  Intimate Partner Violence:  Not At Risk (08/13/2023)   Humiliation, Afraid, Rape, and Kick questionnaire    Fear of Current or Ex-Partner: No    Emotionally Abused: No    Physically Abused: No    Sexually Abused: No    Review of Systems  All other systems reviewed and are negative.       Objective    BP (!) 149/82 (BP Location: Right Arm, Patient Position: Sitting, Cuff Size: Large)   Pulse 75   Temp 97.9 F (36.6 C) (Oral)   Resp 16   Ht 6\' 1"  (1.854 m)   Wt (!) 347 lb 12.8 oz (157.8 kg)   SpO2 98%   BMI 45.89 kg/m   Physical Exam Vitals and nursing note reviewed.  Constitutional:      General: She is not in acute distress. Cardiovascular:     Rate and Rhythm: Normal rate and regular rhythm.   Pulmonary:     Effort: Pulmonary effort is normal.     Breath sounds: Normal breath sounds.  Abdominal:     Palpations: Abdomen is soft.     Tenderness: There is no abdominal tenderness.  Neurological:     General: No focal deficit present.     Mental Status: She is alert and oriented to person, place, and time.         Assessment & Plan:   Class 3 severe obesity due to excess calories with serious comorbidity and body mass index (BMI) of 45.0 to 49.9 in adult [facility])  Primary hypertension -     Metoprolol Succinate ER; Take 1.5 tablets (150 mg total) by mouth daily.  Dispense: 135 tablet; Refill: 0  Other orders -     hydroCHLOROthiazide; Take 1 tablet (25 mg total) by mouth daily.  Dispense: 90 tablet; Refill: 0     Return in about 4 weeks (around 09/10/2023) for follow up, chronic med issues.   Tommie Raymond, MD

## 2023-08-15 ENCOUNTER — Encounter: Payer: Self-pay | Admitting: Family Medicine

## 2023-08-28 ENCOUNTER — Telehealth: Payer: Commercial Managed Care - PPO | Admitting: Physician Assistant

## 2023-08-28 DIAGNOSIS — R6889 Other general symptoms and signs: Secondary | ICD-10-CM

## 2023-08-28 DIAGNOSIS — R509 Fever, unspecified: Secondary | ICD-10-CM

## 2023-08-28 NOTE — Progress Notes (Signed)
 E visit for Flu like symptoms   We are sorry that you are not feeling well.  Here is how we plan to help! Based on what you have shared with me it looks like you may have flu-like symptoms that should be watched but do not seem to indicate anti-viral treatment.  Influenza or "the flu" is   an infection caused by a respiratory virus. The flu virus is highly contagious and persons who did not receive their yearly flu vaccination may "catch" the flu from close contact.   Based upon your symptoms and potential risk factors I recommend that you follow the flu symptoms recommendation that I have listed below.  Please keep well-hydrated and try to get plenty of rest. If you have a humidifier, place it in the bedroom and run it at night. Start a saline nasal rinse for nasal congestion. You can consider use of a nasal steroid spray like Flonase or Nasacort OTC. You can alternate between Tylenol and Ibuprofen if needed for fever, body aches, headache and/or throat pain. Salt water-gargles and chloraseptic spray can be very beneficial for sore throat. Mucinex-DM for congestion or cough. Please take all prescribed medications as directed.  Remain out of work until [employer] for 24 hours without a fever-reducing medication, and you are feeling better.  You should mask until symptoms are resolved.  If anything worsens despite treatment, you need to be evaluated in-person. Please do not delay care.   ANYONE WHO HAS FLU SYMPTOMS SHOULD: Stay home. The flu is highly contagious and going out or to work exposes others! Be sure to drink plenty of fluids. Water is fine as well as fruit juices, sodas and electrolyte beverages. You may want to stay away from caffeine or alcohol. If you are nauseated, try taking small sips of liquids. How do you know if you are getting enough fluid? Your urine should be a pale yellow or almost colorless. Get rest. Taking a steamy shower or using a humidifier may help nasal  congestion and ease sore throat pain. Using a saline nasal spray works much the same way. Cough drops, hard candies and sore throat lozenges may ease your cough. Line up a caregiver. Have someone check on you regularly.   GET HELP RIGHT AWAY IF: You cannot keep down liquids or your medications. You become short of breath Your fell like you are going to pass out or loose consciousness. Your symptoms persist after you have completed your treatment plan MAKE SURE YOU  Understand these instructions. Will watch your condition. Will get help right away if you are not doing well or get worse.  Your e-visit answers were reviewed by a board certified advanced clinical practitioner to complete your personal care plan.  Depending on the condition, your plan could have included both over the counter or prescription medications.  If there is a problem please reply  once you have received a response from your provider.  Your safety is important to [place].  If you have drug allergies check your prescription carefully.    You can use MyChart to ask questions about today's visit, request a non-urgent call back, or ask for a work or school excuse for 24 hours related to this e-Visit. If it has been greater than 24 hours you will need to follow up with your provider, or enter a new e-Visit to address those concerns.  You will get an e-mail in the next two days asking about your experience.  I hope that your e-visit  has been valuable and will speed your recovery. Thank you for using e-visits.

## 2023-08-28 NOTE — Progress Notes (Signed)
 I have spent 5 minutes in review of e-visit questionnaire, review and updating patient chart, medical decision making and response to patient.   Piedad Climes, PA-C

## 2023-09-10 ENCOUNTER — Telehealth: Admitting: Physician Assistant

## 2023-09-10 DIAGNOSIS — A084 Viral intestinal infection, unspecified: Secondary | ICD-10-CM | POA: Diagnosis not present

## 2023-09-10 MED ORDER — ONDANSETRON 4 MG PO TBDP: 4.0000 mg | ORAL_TABLET | Freq: Three times a day (TID) | ORAL | 0 refills | Status: DC | PRN

## 2023-09-10 NOTE — Progress Notes (Signed)
 E-Visit for Nausea and Vomiting   We are sorry that you are not feeling well. Here is how we plan to help!  Based on what you have shared with me it looks like you have a Virus that is irritating your GI tract.  Vomiting is the forceful emptying of a portion of the stomach's content through the mouth.  Although nausea and vomiting can make you feel miserable, it's important to remember that these are not diseases, but rather symptoms of an underlying illness.  When we treat short term symptoms, we always caution that any symptoms that persist should be fully evaluated in a medical office.  I have prescribed a medication that will help alleviate your symptoms and allow you to stay hydrated:  Zofran 4 mg 1 tablet every 8 hours as needed for nausea and vomiting  For your symptoms of diarrhea you may take Imodium 2 mg tablets that are over the counter at your local pharmacy. Take two tablet now and then one after each loose stool up to 6 a day.    HOME CARE: Drink clear liquids.  This is very important! Dehydration (the lack of fluid) can lead to a serious complication.  Start off with 1 tablespoon every 5 minutes for 8 hours. You may begin eating bland foods after 8 hours without vomiting.  Start with saltine crackers, white bread, rice, mashed potatoes, applesauce. After 48 hours on a bland diet, you may resume a normal diet. Try to go to sleep.  Sleep often empties the stomach and relieves the need to vomit.  GET HELP RIGHT AWAY IF:  Your symptoms do not improve or worsen within 2 days after treatment. You have a fever for over 3 days. You cannot keep down fluids after trying the medication.  MAKE SURE YOU:  Understand these instructions. Will watch your condition. Will get help right away if you are not doing well or get worse.    Thank you for choosing an e-visit.  Your e-visit answers were reviewed by a board certified advanced clinical practitioner to complete your personal care  plan. Depending upon the condition, your plan could have included both over the counter or prescription medications.  Please review your pharmacy choice. Make sure the pharmacy is open so you can pick up prescription now. If there is a problem, you may contact your provider through [employer] and have the prescription routed to another pharmacy.  Your safety is important to [place]. If you have drug allergies check your prescription carefully.   For the next 24 hours you can use MyChart to ask questions about today's visit, request a non-urgent call back, or ask for a work or school excuse. You will get an email in the next two days asking about your experience. I hope that your e-visit has been valuable and will speed your recovery.  I have spent 5 minutes in review of e-visit questionnaire, review and updating patient chart, medical decision making and response to patient.   [name], PA-C

## 2023-09-12 ENCOUNTER — Telehealth: Admitting: Physician Assistant

## 2023-09-12 ENCOUNTER — Telehealth

## 2023-09-12 DIAGNOSIS — R6889 Other general symptoms and signs: Secondary | ICD-10-CM

## 2023-09-12 MED ORDER — OSELTAMIVIR PHOSPHATE 75 MG PO CAPS: 75.0000 mg | ORAL_CAPSULE | Freq: Two times a day (BID) | ORAL | 0 refills | Status: AC

## 2023-09-12 NOTE — Patient Instructions (Signed)
 [name], thank you for joining Piedad Climes, PA-C for today's virtual visit.  While this provider is not your primary care provider (PCP), if your PCP is located in our provider database this encounter information will be shared with them immediately following your visit.   A [facility] MyChart account gives you access to today's visit and all your visits, tests, and labs performed at [facility] " click here if you don't have a [facility] MyChart account or go to [email or web address]  Consent: (Patient) Claudia Delacruz provided verbal consent for this virtual visit at the beginning of the encounter.  Current Medications:  Current Outpatient Medications:    oseltamivir (TAMIFLU) 75 MG capsule, Take 1 capsule (75 mg total) by mouth 2 (two) times daily for 5 days., Disp: 10 capsule, Rfl: 0   hydrochlorothiazide (HYDRODIURIL) 25 MG tablet, Take 1 tablet (25 mg total) by mouth daily., Disp: 90 tablet, Rfl: 0   Iron, Ferrous Sulfate, 325 (65 Fe) MG TABS, Take 325 mg by mouth daily., Disp: 90 tablet, Rfl: 0   metoprolol succinate (TOPROL-XL) 100 MG 24 hr tablet, Take 1.5 tablets (150 mg total) by mouth daily., Disp: 135 tablet, Rfl: 0   ondansetron (ZOFRAN-ODT) 4 MG disintegrating tablet, Take 1-2 tablets (4-8 mg total) by mouth every 8 (eight) hours as needed., Disp: 20 tablet, Rfl: 0   Medications ordered in this encounter:  Meds ordered this encounter  Medications   oseltamivir (TAMIFLU) 75 MG capsule    Sig: Take 1 capsule (75 mg total) by mouth 2 (two) times daily for 5 days.    Dispense:  10 capsule    Refill:  0    Supervising Provider:   Merrilee Jansky [[id number]]     *If you need refills on other medications prior to your next appointment, please contact your pharmacy*  Follow-Up: Call back or seek an in-person evaluation if the symptoms worsen or if the condition fails to improve as anticipated.  [facility] [phone]  Other Instructions Please keep well-hydrated and try to get plenty of rest. If you have a humidifier, place it in the bedroom and run it at night. Start a saline nasal rinse for nasal congestion. You can consider use of a nasal steroid spray like Flonase or Nasacort OTC. You can alternate between Tylenol and Ibuprofen if needed for fever, body aches, headache and/or throat pain. Salt water-gargles and chloraseptic spray can be very beneficial for sore throat. Mucinex-DM for congestion or cough. Please take all prescribed medications as directed.  Remain out of work until [employer] for 24 hours without a fever-reducing medication, and you are feeling better.  You should mask until symptoms are resolved.  If anything worsens despite treatment, you need to be evaluated in-person. Please do not delay care.  Influenza, Adult Influenza is also called "the flu." It is an infection in the lungs, nose, and throat (respiratory tract). It spreads easily from person to person (is contagious). The flu causes symptoms that are like a cold, along with high fever and body aches. What are the causes? This condition is caused by the influenza virus. You can get the virus by: Breathing in droplets that are in the air after a person infected with the flu coughed or sneezed. Touching something that has the virus on it and then touching your mouth, nose, or eyes. What increases the risk? Certain things may make you more likely to get the flu. These include: Not washing  your hands often. Having close contact with many people during cold and flu season. Touching your mouth, eyes, or nose without first washing your hands. Not getting a flu shot every year. You may have a higher risk for the flu, and serious problems, such as a lung infection (pneumonia), if you: Are older than 65. Are pregnant. Have a weakened disease-fighting system (immune system) because of a disease or because you are taking  certain medicines. Have a long-term (chronic) condition, such as: Heart, kidney, or lung disease. Diabetes. Asthma. Have a liver disorder. Are very overweight (morbidly obese). Have anemia. What are the signs or symptoms? Symptoms usually begin suddenly and last 4-14 days. They may include: Fever and chills. Headaches, body aches, or muscle aches. Sore throat. Cough. Runny or stuffy (congested) nose. Feeling discomfort in your chest. Not wanting to eat as much as normal. Feeling weak or tired. Feeling dizzy. Feeling sick to your stomach or throwing up. How is this treated? If the flu is found early, you can be treated with antiviral medicine. This can help to reduce how bad the illness is and how long it lasts. This may be given by mouth or through an IV tube. Taking care of yourself at home can help your symptoms get better. Your doctor may want you to: Take over-the-counter medicines. Drink plenty of fluids. The flu often goes away on its own. If you have very bad symptoms or other problems, you may be treated in a hospital. Follow these instructions at home:     Activity Rest as needed. Get plenty of sleep. Stay home from work or school as told by your doctor. Do not leave home until you do not have a fever for 24 hours without taking medicine. Leave home only to go to your doctor. Eating and drinking Take an ORS (oral rehydration solution). This is a drink that is sold at pharmacies and stores. Drink enough fluid to keep your pee pale yellow. Drink clear fluids in small amounts as you are able. Clear fluids include: Water. Ice chips. Fruit juice mixed with water. Low-calorie sports drinks. Eat bland foods that are easy to digest. Eat small amounts as you are able. These foods include: Bananas. Applesauce. Rice. Lean meats. Toast. Crackers. Do not eat or drink: Fluids that have a lot of sugar or caffeine. Alcohol. Spicy or fatty foods. General  instructions Take over-the-counter and prescription medicines only as told by your doctor. Use a cool mist humidifier to add moisture to the air in your home. This can make it easier for you to breathe. When using a cool mist humidifier, clean it daily. Empty water and replace with clean water. Cover your mouth and nose when you cough or sneeze. Wash your hands with soap and water often and for at least 20 seconds. This is also important after you cough or sneeze. If you cannot use soap and water, use alcohol-based hand sanitizer. Keep all follow-up visits. How is this prevented?  Get a flu shot every year. You may get the flu shot in late summer, fall, or winter. Ask your doctor when you should get your flu shot. Avoid contact with people who are sick during fall and winter. This is cold and flu season. Contact a doctor if: You get new symptoms. You have: Chest pain. Watery poop (diarrhea). A fever. Your cough gets worse. You start to have more mucus. You feel sick to your stomach. You throw up. Get help right away if you: Have shortness  of breath. Have trouble breathing. Have skin or nails that turn a bluish color. Have very bad pain or stiffness in your neck. Get a sudden headache. Get sudden pain in your face or ear. Cannot eat or drink without throwing up. These symptoms may represent a serious problem that is an emergency. Get medical help right away. Call your local emergency services (911 in the [place] Do not wait to see if the symptoms will go away. Do not drive yourself to the hospital. Summary Influenza is also called "the flu." It is an infection in the lungs, nose, and throat. It spreads easily from person to person. Take over-the-counter and prescription medicines only as told by your doctor. Getting a flu shot every year is the best way to not get the flu. This information is not intended to replace advice given to you by your health care provider. Make sure you  discuss any questions you have with your health care provider. Document Revised: 02/06/2020 Document Reviewed: 02/06/2020 Elsevier Patient Education  2023 Elsevier Inc.      If you have been instructed to have an in-person evaluation today at a local Urgent Care facility, please use the link below. It will take you to a list of all of our available [facility], including address, phone number and hours of operation. Please do not delay care.  [facility]  If you or a family member do not have a primary care provider, use the link below to schedule a visit and establish care. When you choose a [facility] primary care physician or advanced practice provider, you gain a long-term partner in health. Find a Primary Care Provider  Learn more about [facility]'s in-office and virtual care options: [facility] - Get Care Now

## 2023-09-12 NOTE — Progress Notes (Signed)
 Virtual Visit Consent   Claudia Delacruz, you are scheduled for a virtual visit with a [facility] provider today. Just as with appointments in the office, your consent must be obtained to participate. Your consent will be active for this visit and any virtual visit you may have with one of our providers in the next 365 days. If you have a MyChart account, a copy of this consent can be sent to you electronically.  As this is a virtual visit, video technology does not allow for your provider to perform a traditional examination. This may limit your provider's ability to fully assess your condition. If your provider identifies any concerns that need to be evaluated in person or the need to arrange testing (such as labs, EKG, etc.), we will make arrangements to do so. Although advances in technology are sophisticated, we cannot ensure that it will always work on either your end or our end. If the connection with a video visit is poor, the visit may have to be switched to a telephone visit. With either a video or telephone visit, we are not always able to ensure that we have a secure connection.  By engaging in this virtual visit, you consent to the provision of healthcare and authorize for your insurance to be billed (if applicable) for the services provided during this visit. Depending on your insurance coverage, you may receive a charge related to this service.  I need to obtain your verbal consent now. Are you willing to proceed with your visit today? Claudia Delacruz has provided verbal consent on 09/12/2023 for a virtual visit (video or telephone). Piedad Climes, [place]  Date: 09/12/2023 3:47 PM   Virtual Visit via Video Note   I, Piedad Climes, connected with  Claudia Delacruz  ([id number], [birth year]) on 09/12/23 at  3:30 PM EDT by a video-enabled telemedicine application and verified that I am speaking with the correct person using two identifiers.  Location: Patient: Virtual Visit  Location Patient: Home Provider: Virtual Visit Location Provider: Home Office   I discussed the limitations of evaluation and management by telemedicine and the availability of in person appointments. The patient expressed understanding and agreed to proceed.    History of Present Illness: Claudia Delacruz is a 28 y.o. who identifies as a female who was assigned female at birth, and is being seen today for 1 day of nasal congestion with sinus pressure, sore throat, body aches, chills, fever. Notes slight cough that is manageable. Notes sick contacts. Has not tested for COVID/flu. OTC -- Theraflu.   HPI: HPI  Problems:  Patient Active Problem List   Diagnosis Date Noted   Bilateral foot pain 07/26/2015   Pes planus of both feet 07/26/2015   Left hand pain 06/01/2015   Hypertrophic and atrophic condition of skin 09/09/2007   KNEE PAIN, CHRONIC 09/09/2007   Anemia 08/30/2006   Allergic rhinitis 08/30/2006    Allergies: No Known Allergies Medications:  Current Outpatient Medications:    oseltamivir (TAMIFLU) 75 MG capsule, Take 1 capsule (75 mg total) by mouth 2 (two) times daily for 5 days., Disp: 10 capsule, Rfl: 0   hydrochlorothiazide (HYDRODIURIL) 25 MG tablet, Take 1 tablet (25 mg total) by mouth daily., Disp: 90 tablet, Rfl: 0   Iron, Ferrous Sulfate, 325 (65 Fe) MG TABS, Take 325 mg by mouth daily., Disp: 90 tablet, Rfl: 0   metoprolol succinate (TOPROL-XL) 100 MG 24 hr tablet, Take 1.5 tablets (150 mg total) by mouth  daily., Disp: 135 tablet, Rfl: 0   ondansetron (ZOFRAN-ODT) 4 MG disintegrating tablet, Take 1-2 tablets (4-8 mg total) by mouth every 8 (eight) hours as needed., Disp: 20 tablet, Rfl: 0  Observations/Objective: Patient is well-developed, well-nourished in no acute distress.  Resting comfortably at home.  Head is normocephalic, atraumatic.  No labored breathing. Speech is clear and coherent with logical content.  Patient is alert and oriented at baseline.    Assessment and Plan: 1. Flu-like symptoms (Primary) - oseltamivir (TAMIFLU) 75 MG capsule; Take 1 capsule (75 mg total) by mouth 2 (two) times daily for 5 days.  Dispense: 10 capsule; Refill: 0  Classic influenza symptoms. Supportive measures, OTC medications and Vitamin recommendations reviewed. Will start Tamiflu per orders. Quarantine reviewed with patient.    Follow Up Instructions: I discussed the assessment and treatment plan with the patient. The patient was provided an opportunity to ask questions and all were answered. The patient agreed with the plan and demonstrated an understanding of the instructions.  A copy of instructions were sent to the patient via MyChart unless otherwise noted below.   The patient was advised to call back or seek an in-person evaluation if the symptoms worsen or if the condition fails to improve as anticipated.    Piedad Climes, PA-C

## 2023-09-13 ENCOUNTER — Encounter: Payer: Commercial Managed Care - PPO | Admitting: Family

## 2023-09-13 ENCOUNTER — Telehealth: Payer: Self-pay | Admitting: Family

## 2023-09-13 NOTE — Telephone Encounter (Signed)
 Called pt and left vm to call office back to reschedule missed appt

## 2023-09-13 NOTE — Progress Notes (Signed)
 Erroneous encounter-disregard

## 2023-09-20 ENCOUNTER — Telehealth: Admitting: Nurse Practitioner

## 2023-09-20 DIAGNOSIS — H66003 Acute suppurative otitis media without spontaneous rupture of ear drum, bilateral: Secondary | ICD-10-CM | POA: Diagnosis not present

## 2023-09-20 MED ORDER — FLUTICASONE PROPIONATE 50 MCG/ACT NA SUSP: 2.0000 | Freq: Every day | NASAL | 6 refills | Status: DC

## 2023-09-20 MED ORDER — AMOXICILLIN-POT CLAVULANATE 875-125 MG PO TABS: 1.0000 | ORAL_TABLET | Freq: Two times a day (BID) | ORAL | 0 refills | Status: DC

## 2023-09-20 NOTE — Progress Notes (Signed)
 E-Visit for Ear Pain - Acute Otitis Media   We are sorry that you are not feeling well. Here is how we plan to help!  Based on what you have shared with me it looks like you have Acute Otitis Media.  Acute Otitis Media is an infection of the middle or "inner" ear. This type of infection can cause redness, inflammation, and fluid buildup behind the tympanic membrane (ear drum).  The usual symptoms include: Earache/Pain Fever Upper respiratory symptoms Lack of energy/Fatigue/Malaise Slight hearing loss gradually worsening- if the inner ear fills with fluid What causes middle ear infections? Most middle ear infections occur when an infection such as a cold, leads to a build-up of mucus in the middle ear and causes the Eustachian tube (a thin tube that runs from the middle ear to the back of the nose) to become swollen or blocked.   This means mucus can't drain away properly, making it easier for an infection to spread into the middle ear.  How middle ear infections are treated: Most ear infections clear up within three to five days and don't need any specific treatment. If necessary, tylenol or ibuprofen should be used to relieve pain and a high temperature.  If you develop a fever higher than 102, or any significantly worsening symptoms, this could indicate a more serious infection moving to the middle/inner and needs face to face evaluation in an office by a provider.   Antibiotics aren't routinely used to treat middle ear infections, although they may occasionally be prescribed if symptoms persist or are particularly severe. Given your presentation,   I have prescribed Augmentin 875-125 mg one tablet by mouth twice a day for 10 days  Meds ordered this encounter  Medications   amoxicillin-clavulanate (AUGMENTIN) 875-125 MG tablet    Sig: Take 1 tablet by mouth 2 (two) times daily.    Dispense:  20 tablet    Refill:  0   fluticasone (FLONASE) 50 MCG/ACT nasal spray    Sig: Place 2  sprays into both nostrils daily.    Dispense:  16 g    Refill:  6      Your symptoms should improve over the next 3 days and should resolve in about 7 days. Be sure to complete ALL of the prescription(s) given.  HOME CARE: Wash your hands frequently. If you are prescribed an ear drop, do not place the tip of the bottle on your ear or touch it with your fingers. You can take Acetaminophen 650 mg every 4-6 hours as needed for pain.  If pain is severe or moderate, you can apply a heating pad (set on low) or hot water bottle (wrapped in a towel) to outer ear for 20 minutes.  This will also increase drainage.  GET HELP RIGHT AWAY IF: Fever is over 102.2 degrees. You develop progressive ear pain or hearing loss. Ear symptoms persist longer than 3 days after treatment.  MAKE SURE YOU: Understand these instructions. Will watch your condition. Will get help right away if you are not doing well or get worse.  Thank you for choosing an e-visit.  Your e-visit answers were reviewed by a board certified advanced clinical practitioner to complete your personal care plan. Depending upon the condition, your plan could have included both over the counter or prescription medications.  Please review your pharmacy choice. Make sure the pharmacy is open so you can pick up the prescription now. If there is a problem, you may contact your provider through  MyChart messaging and have the prescription routed to another pharmacy.  Your safety is important to [place]. If you have drug allergies check your prescription carefully.   For the next 24 hours you can use MyChart to ask questions about today's visit, request a non-urgent call back, or ask for a work or school excuse. You will get an email with a survey after your eVisit asking about your experience. We would appreciate your feedback. I hope that your e-visit has been valuable and will aid in your recovery.  I spent approximately 5 minutes reviewing the  patient's history, current symptoms and coordinating their care today.

## 2023-09-25 ENCOUNTER — Telehealth: Admitting: Physician Assistant

## 2023-09-25 DIAGNOSIS — J069 Acute upper respiratory infection, unspecified: Secondary | ICD-10-CM

## 2023-09-25 NOTE — Progress Notes (Signed)
  Because of the number of digital health visits for this/similar symptoms over the past few weeks and need for a more in-depth examination, I feel your condition warrants further evaluation and I recommend that you be seen in a face-to-face visit.   NOTE: There will be NO CHARGE for this E-Visit   If you are having a true medical emergency, please call 911.     For an urgent face to face visit, [facility] has multiple urgent care centers for your convenience.  Click the link below for the full list of locations and hours, walk-in wait times, appointment scheduling options and driving directions:  Urgent Care - [place], [place], [place], [place], [place], [place]  [place]     Your MyChart E-visit questionnaire answers were reviewed by a board certified advanced clinical practitioner to complete your personal care plan based on your specific symptoms.    Thank you for using e-Visits.

## 2023-09-26 ENCOUNTER — Ambulatory Visit (HOSPITAL_COMMUNITY)
Admission: RE | Admit: 2023-09-26 | Discharge: 2023-09-26 | Disposition: A | Discharge status: Home / Self Care | Source: Ambulatory Visit | Attending: Physician Assistant | Admitting: Physician Assistant

## 2023-09-26 ENCOUNTER — Encounter (HOSPITAL_COMMUNITY): Payer: Self-pay

## 2023-09-26 ENCOUNTER — Ambulatory Visit (INDEPENDENT_AMBULATORY_CARE_PROVIDER_SITE_OTHER)

## 2023-09-26 ENCOUNTER — Other Ambulatory Visit: Payer: Self-pay

## 2023-09-26 VITALS — BP 155/81 | HR 69 | Temp 98.1°F | Resp 18

## 2023-09-26 DIAGNOSIS — R051 Acute cough: Secondary | ICD-10-CM

## 2023-09-26 DIAGNOSIS — J189 Pneumonia, unspecified organism: Secondary | ICD-10-CM

## 2023-09-26 MED ORDER — PREDNISONE 20 MG PO TABS: 40.0000 mg | ORAL_TABLET | Freq: Every day | ORAL | 0 refills | Status: AC

## 2023-09-26 MED ORDER — DOXYCYCLINE HYCLATE 100 MG PO CAPS: 100.0000 mg | ORAL_CAPSULE | Freq: Two times a day (BID) | ORAL | 0 refills | Status: DC

## 2023-09-26 NOTE — Discharge Instructions (Addendum)
 I did not see anything on your x-ray but I am concerned given your symptoms.  If the radiologist sees something else and we need to start additional antibiotics I will call you.  Start doxycycline 100 mg twice daily for 10 days.  Stay out of the sun while on this medication.  Start prednisone 40 mg for 4 days.  Do not take NSAIDs with this medication including aspirin, ibuprofen/Advil, naproxen/Aleve.  You can use Tylenol, Mucinex, Flonase.  Make sure you rest and drink plenty fluid.  Make sure you rest and drink plenty of fluid.  If your symptoms or not improving within 3 to 5 days or if anything worsens you should be seen immediately including chest pain, shortness of breath, fever, nausea/vomiting interfering with oral intake.

## 2023-09-26 NOTE — ED Triage Notes (Signed)
 Reports head and chest congestion for 3 weeks.  2 weeks ago, took a home covid test and it was positive.  Patient did have several virtual visits, was prescribed medications and was starting to feel better.  Since Sunday, symptoms have been worsening.   Patient states she did not pick up antibiotic

## 2023-09-26 NOTE — ED Provider Notes (Signed)
 [facility]    CSN: [id number] Arrival date & time: 09/26/23  1338      History   Chief Complaint Chief Complaint  Patient presents with   Appointment    HPI Claudia Delacruz is a 28 y.o. female.   Patient presents today with a 3-week history of URI symptoms.  Reports that initially she had a fever with URI symptoms and so took an at home COVID test that was positive.  She initially started improving only to have worsening of symptoms in the past week.  She has completed several virtual visits and was prescribed Augmentin but was never able to pick up this medication and so has not taken any antibiotics or steroids in the past 90 days.  She has been using TheraFlu and Tylenol without improvement of symptoms.  She denies any history of allergies, asthma, COPD, smoking.  She has had COVID with her last episode last year.  She has had COVID-19 vaccinations.  She has no concern for pregnancy.    Past Medical History:  Diagnosis Date   Hypertension    Vitamin D deficiency     Patient Active Problem List   Diagnosis Date Noted   Bilateral foot pain 07/26/2015   Pes planus of both feet 07/26/2015   Left hand pain 06/01/2015   Hypertrophic and atrophic condition of skin 09/09/2007   KNEE PAIN, CHRONIC 09/09/2007   Anemia 08/30/2006   Allergic rhinitis 08/30/2006    Past Surgical History:  Procedure Laterality Date   NO PAST SURGERIES      OB History   No obstetric history on file.      Home Medications    Prior to Admission medications   Medication Sig Start Date End Date Taking? Authorizing Provider  doxycycline (VIBRAMYCIN) 100 MG capsule Take 1 capsule (100 mg total) by mouth 2 (two) times daily. 09/26/23  Yes Jola Critzer K, PA-C  predniSONE (DELTASONE) 20 MG tablet Take 2 tablets (40 mg total) by mouth daily for 4 days. 09/26/23 09/30/23 Yes Zyere Jiminez K, PA-C  fluticasone (FLONASE) 50 MCG/ACT nasal spray Place 2 sprays into both nostrils daily. 09/20/23    Viviano Simas, FNP  Iron, Ferrous Sulfate, 325 (65 Fe) MG TABS Take 325 mg by mouth daily. 05/11/23 08/09/23  Rema Fendt, NP  metoprolol succinate (TOPROL-XL) 100 MG 24 hr tablet Take 1.5 tablets (150 mg total) by mouth daily. 08/13/23 11/11/23  Georganna Skeans, MD  ondansetron (ZOFRAN-ODT) 4 MG disintegrating tablet Take 1-2 tablets (4-8 mg total) by mouth every 8 (eight) hours as needed. 09/10/23   [name], PA-C    Family History Family History  Problem Relation Age of Onset   Healthy Mother    Diabetes Mellitus II Maternal Grandmother    Heart failure Maternal Grandfather    Anemia Sister    Neuropathy Neg Hx     Social History Social History   Tobacco Use   Smoking status: Never   Smokeless tobacco: Never  Vaping Use   Vaping status: Never Used  Substance Use Topics   Alcohol use: No   Drug use: No     Allergies   Patient has no known allergies.   Review of Systems Review of Systems  Constitutional:  Positive for activity change. Negative for appetite change, fatigue and fever.  HENT:  Positive for congestion, postnasal drip, sinus pressure and sore throat. Negative for sneezing.   Respiratory:  Positive for cough. Negative for shortness of breath.  Cardiovascular:  Negative for chest pain.  Gastrointestinal:  Negative for abdominal pain, diarrhea, nausea and vomiting.  Neurological:  Negative for dizziness, light-headedness and headaches.     Physical Exam Triage Vital Signs ED Triage Vitals  Encounter Vitals Group     BP 09/26/23 1410 (!) 178/93     Systolic BP Percentile --      Diastolic BP Percentile --      Pulse Rate 09/26/23 1410 69     Resp 09/26/23 1410 18     Temp 09/26/23 1410 98.1 F (36.7 C)     Temp Source 09/26/23 1410 Oral     SpO2 09/26/23 1410 97 %     Weight --      Height --      Head Circumference --      Peak Flow --      Pain Score 09/26/23 1407 0     Pain Loc --      Pain Education --      Exclude from Growth  Chart --    No data found.  Updated Vital Signs BP (!) 155/81 (BP Location: Right Arm) Comment (BP Location): repositioned  Pulse 69   Temp 98.1 F (36.7 C) (Oral)   Resp 18   LMP 09/19/2023   SpO2 97%   Visual Acuity Right Eye Distance:   Left Eye Distance:   Bilateral Distance:    Right Eye Near:   Left Eye Near:    Bilateral Near:     Physical Exam Vitals reviewed.  Constitutional:      General: She is awake. She is not in acute distress.    Appearance: Normal appearance. She is well-developed. She is not ill-appearing.     Comments: Very pleasant female appears stated age in no acute distress sitting comfortably in exam room  HENT:     Head: Normocephalic and atraumatic.     Right Ear: Tympanic membrane, ear canal and external ear normal. Tympanic membrane is not erythematous or bulging.     Left Ear: Ear canal and external ear normal. A middle ear effusion is present. Tympanic membrane is not erythematous or bulging.     Nose:     Right Sinus: No maxillary sinus tenderness or frontal sinus tenderness.     Left Sinus: No maxillary sinus tenderness or frontal sinus tenderness.     Mouth/Throat:     Pharynx: Uvula midline. Postnasal drip present. No oropharyngeal exudate or posterior oropharyngeal erythema.  Cardiovascular:     Rate and Rhythm: Normal rate and regular rhythm.     Heart sounds: Normal heart sounds, S1 normal and S2 normal. No murmur heard. Pulmonary:     Effort: Pulmonary effort is normal.     Breath sounds: Normal breath sounds. No wheezing, rhonchi or rales.     Comments: Clear to auscultation bilaterally Psychiatric:        Behavior: Behavior is cooperative.      UC Treatments / Results  Labs (all labs ordered are listed, but only abnormal results are displayed) Labs Reviewed - No data to display  EKG   Radiology No results found.  Procedures Procedures (including critical care time)  Medications Ordered in UC Medications - No data  to display  Initial Impression / Assessment and Plan / UC Course  I have reviewed the triage vital signs and the nursing notes.  Pertinent labs & imaging results that were available during my care of the patient were reviewed by me and considered  in my medical decision making (see chart for details).     Patient is well-appearing, afebrile, nontoxic, nontachycardic.  Viral testing was deferred as patient has been symptomatic for several days and this would not change management.  Chest x-ray was obtained that showed patchy infiltrates without focal consolidation.  Will treat for atypical pneumonia with doxycycline we discussed that she should avoid prolonged sun exposure while on this medication due to associated photosensitivity.  She was started on prednisone to help with her symptoms with instruction not to take NSAIDs with this medicine.  She can use over-the-counter medication including Mucinex, Flonase, Tylenol for additional symptom relief.  Discussed that if her symptoms are not improving within 3 to 5 days or if anything worsens and she has high fever, worsening cough, shortness of breath, chest pain, nausea/vomiting interfering with oral intake, weakness she needs to be seen immediately.  Strict return precautions given.  Excuse note provided.  Final Clinical Impressions(s) / UC Diagnoses   Final diagnoses:  Acute cough  Atypical pneumonia     Discharge Instructions      I did not see anything on your x-ray but I am concerned given your symptoms.  If the radiologist sees something else and we need to start additional antibiotics I will call you.  Start doxycycline 100 mg twice daily for 10 days.  Stay out of the sun while on this medication.  Start prednisone 40 mg for 4 days.  Do not take NSAIDs with this medication including aspirin, ibuprofen/Advil, naproxen/Aleve.  You can use Tylenol, Mucinex, Flonase.  Make sure you rest and drink plenty fluid.  Make sure you rest and drink  plenty of fluid.  If your symptoms or not improving within 3 to 5 days or if anything worsens you should be seen immediately including chest pain, shortness of breath, fever, nausea/vomiting interfering with oral intake.     ED Prescriptions     Medication Sig Dispense Auth. Provider   doxycycline (VIBRAMYCIN) 100 MG capsule Take 1 capsule (100 mg total) by mouth 2 (two) times daily. 20 capsule Grover Robinson K, PA-C   predniSONE (DELTASONE) 20 MG tablet Take 2 tablets (40 mg total) by mouth daily for 4 days. 8 tablet Wavie Hashimi, Noberto Retort, PA-C      PDMP not reviewed this encounter.   Jeani Hawking, PA-C 09/26/23 1553

## 2023-10-08 ENCOUNTER — Telehealth: Admitting: Physician Assistant

## 2023-10-08 DIAGNOSIS — M545 Low back pain, unspecified: Secondary | ICD-10-CM

## 2023-10-08 MED ORDER — CYCLOBENZAPRINE HCL 10 MG PO TABS
5.0000 mg | ORAL_TABLET | Freq: Three times a day (TID) | ORAL | 0 refills | Status: DC | PRN
Start: 2023-10-08 — End: 2024-02-25

## 2023-10-08 MED ORDER — NAPROXEN 500 MG PO TABS: 500.0000 mg | ORAL_TABLET | Freq: Two times a day (BID) | ORAL | 0 refills | Status: DC

## 2023-10-08 NOTE — Progress Notes (Signed)

## 2023-10-16 ENCOUNTER — Telehealth: Admitting: Physician Assistant

## 2023-10-16 DIAGNOSIS — M5442 Lumbago with sciatica, left side: Secondary | ICD-10-CM

## 2023-10-16 NOTE — Progress Notes (Signed)
  Because of ongoing/progressing symptoms despite treatment via e-visit, I feel your condition warrants further evaluation and I recommend that you be seen in a face-to-face visit.   NOTE: There will be NO CHARGE for this E-Visit   If you are having a true medical emergency, please call 911.     For an urgent face to face visit, [facility] has multiple urgent care centers for your convenience.  Click the link below for the full list of locations and hours, walk-in wait times, appointment scheduling options and driving directions:  Urgent Care - [place], [place], [place], [place], [place], [place]  [place]     Your MyChart E-visit questionnaire answers were reviewed by a board certified advanced clinical practitioner to complete your personal care plan based on your specific symptoms.    Thank you for using e-Visits.

## 2023-10-17 ENCOUNTER — Encounter: Payer: Self-pay | Admitting: Family

## 2023-10-17 ENCOUNTER — Ambulatory Visit (INDEPENDENT_AMBULATORY_CARE_PROVIDER_SITE_OTHER): Admitting: Family

## 2023-10-17 VITALS — BP 133/76 | HR 73 | Temp 98.4°F | Resp 18 | Ht 73.0 in | Wt 344.6 lb

## 2023-10-17 DIAGNOSIS — Z7689 Persons encountering health services in other specified circumstances: Secondary | ICD-10-CM

## 2023-10-17 DIAGNOSIS — Z6841 Body Mass Index (BMI) 40.0 and over, adult: Secondary | ICD-10-CM | POA: Diagnosis not present

## 2023-10-17 DIAGNOSIS — R635 Abnormal weight gain: Secondary | ICD-10-CM

## 2023-10-17 DIAGNOSIS — I10 Essential (primary) hypertension: Secondary | ICD-10-CM

## 2023-10-17 MED ORDER — METOPROLOL SUCCINATE ER 100 MG PO TB24: 150.0000 mg | ORAL_TABLET | Freq: Every day | ORAL | 0 refills | Status: AC

## 2023-10-17 MED ORDER — PHENTERMINE HCL 15 MG PO CAPS: 15.0000 mg | ORAL_CAPSULE | ORAL | 0 refills | Status: AC

## 2023-10-17 NOTE — Progress Notes (Signed)
 Patient ID: Claudia Delacruz, female    DOB: Jul 22, 1995  MRN: [id number]  CC: Chronic Conditions Follow-Up  Subjective: Claudia Delacruz is a 28 y.o. female who presents for chronic conditions follow-up.  Her concerns today include:  - Doing well on Metoprolol Succinate, no issues/concerns. She does not complain of red flag symptoms such as but not limited to chest pain, shortness of breath, worst headache of life, nausea/vomiting.  - States weight goal is 250 pounds. She is watching what she eats. She exercises.   Patient Active Problem List   Diagnosis Date Noted   Bilateral foot pain 07/26/2015   Pes planus of both feet 07/26/2015   Left hand pain 06/01/2015   Hypertrophic and atrophic condition of skin 09/09/2007   KNEE PAIN, CHRONIC 09/09/2007   Anemia 08/30/2006   Allergic rhinitis 08/30/2006     Current Outpatient Medications on File Prior to Visit  Medication Sig Dispense Refill   cyclobenzaprine (FLEXERIL) 10 MG tablet Take 0.5-1 tablets (5-10 mg total) by mouth 3 (three) times daily as needed. (Patient not taking: Reported on 10/17/2023) 30 tablet 0   doxycycline (VIBRAMYCIN) 100 MG capsule Take 1 capsule (100 mg total) by mouth 2 (two) times daily. (Patient not taking: Reported on 10/17/2023) 20 capsule 0   fluticasone (FLONASE) 50 MCG/ACT nasal spray Place 2 sprays into both nostrils daily. (Patient not taking: Reported on 10/17/2023) 16 g 6   Iron, Ferrous Sulfate, 325 (65 Fe) MG TABS Take 325 mg by mouth daily. 90 tablet 0   naproxen (NAPROSYN) 500 MG tablet Take 1 tablet (500 mg total) by mouth 2 (two) times daily with a meal. (Patient not taking: Reported on 10/17/2023) 30 tablet 0   ondansetron (ZOFRAN-ODT) 4 MG disintegrating tablet Take 1-2 tablets (4-8 mg total) by mouth every 8 (eight) hours as needed. (Patient not taking: Reported on 10/17/2023) 20 tablet 0   No current facility-administered medications on file prior to visit.    No Known Allergies  Social  History   Socioeconomic History   Marital status: Single    Spouse name: Not on file   Number of children: Not on file   Years of education: Not on file   Highest education level: Associate degree: academic program  Occupational History   Not on file  Tobacco Use   Smoking status: Never   Smokeless tobacco: Never  Vaping Use   Vaping status: Never Used  Substance and Sexual Activity   Alcohol use: No   Drug use: No   Sexual activity: Not on file  Other Topics Concern   Not on file  Social History Narrative   She lives with mother.     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   Social Drivers of Corporate investment banker Strain: Low Risk  (05/09/2023)   Overall Financial Resource Strain (CARDIA)    Difficulty of Paying Living Expenses: Not hard at all  Food Insecurity: No Food Insecurity (05/09/2023)   Hunger Vital Sign    Worried About Running Out of Food in the Last Year: Never true    Ran Out of Food in the Last Year: Never true  Transportation Needs: No Transportation Needs (05/09/2023)   PRAPARE - Administrator, Civil Service (Medical): No    Lack of Transportation (Non-Medical): No  Physical Activity: Insufficiently Active (05/09/2023)   Exercise Vital Sign    Days of Exercise per Week: 2 days  Minutes of Exercise per Session: 60 min  Stress: No Stress Concern Present (05/09/2023)   [employer] of Occupational Health - Occupational Stress Questionnaire    Feeling of Stress : Only a little  Social Connections: Socially Isolated (05/09/2023)   Social Connection and Isolation Panel [NHANES]    Frequency of Communication with Friends and Family: More than three times a week    Frequency of Social Gatherings with Friends and Family: Once a week    Attends Religious Services: Never    Database administrator or Organizations: No    Attends Engineer, structural: Not on file    Marital Status: Never  married  Intimate Partner Violence: Not At Risk (08/13/2023)   Humiliation, Afraid, Rape, and Kick questionnaire    Fear of Current or Ex-Partner: No    Emotionally Abused: No    Physically Abused: No    Sexually Abused: No    Family History  Problem Relation Age of Onset   Healthy Mother    Diabetes Mellitus II Maternal Grandmother    Heart failure Maternal Grandfather    Anemia Sister    Neuropathy Neg Hx     Past Surgical History:  Procedure Laterality Date   NO PAST SURGERIES      ROS: Review of Systems Negative except as stated above  PHYSICAL EXAM: BP 133/76   Pulse 73   Temp 98.4 F (36.9 C) (Oral)   Resp 18   Ht 6\' 1"  (1.854 m)   Wt (!) 344 lb 9.6 oz (156.3 kg)   LMP 09/19/2023   SpO2 95%   BMI 45.46 kg/m   Physical Exam HENT:     Head: Normocephalic and atraumatic.     Nose: Nose normal.     Mouth/Throat:     Mouth: Mucous membranes are moist.     Pharynx: Oropharynx is clear.  Eyes:     Extraocular Movements: Extraocular movements intact.     Conjunctiva/sclera: Conjunctivae normal.     Pupils: Pupils are equal, round, and reactive to light.  Cardiovascular:     Rate and Rhythm: Normal rate and regular rhythm.     Pulses: Normal pulses.     Heart sounds: Normal heart sounds.  Pulmonary:     Effort: Pulmonary effort is normal.     Breath sounds: Normal breath sounds.  Musculoskeletal:        General: Normal range of motion.     Cervical back: Normal range of motion and neck supple.  Neurological:     General: No focal deficit present.     Mental Status: She is alert and oriented to person, place, and time.  Psychiatric:        Mood and Affect: Mood normal.        Behavior: Behavior normal.     ASSESSMENT AND PLAN: 1. Primary hypertension (Primary) - Continue Metoprolol Succinate as prescribed.  - Routine screening.  - Counseled on blood pressure goal of less than 130/80, low-sodium, DASH diet, medication compliance, and 150 minutes of  moderate intensity exercise per week as tolerated. Counseled on medication adherence and adverse effects. - Follow-up with primary provider in 3 months or sooner if needed. - metoprolol succinate (TOPROL-XL) 100 MG 24 hr tablet; Take 1.5 tablets (150 mg total) by mouth daily.  Dispense: 135 tablet; Refill: 0 - Basic Metabolic Panel  2. Encounter for weight management 3. BMI 45.0-49.9, adult (HCC) 4. Weight gain - Phentermine as prescribed. Counseled on medication adherence and  adverse effects.  - I did check the [facility]  prescription drug database.  - Counseled on low-sodium DASH diet and 150 minutes of moderate intensity exercise per week as tolerated to assist with weight management.  - Referral to Medical Weight Management for evaluation/management. - Follow-up with primary provider as scheduled. - phentermine 15 MG capsule; Take 1 capsule (15 mg total) by mouth every morning.  Dispense: 30 capsule; Refill: 0 - Amb Ref to Medical Weight Management   Patient was given the opportunity to ask questions.  Patient verbalized understanding of the plan and was able to repeat key elements of the plan. Patient was given clear instructions to go to Emergency Department or return to medical center if symptoms don't improve, worsen, or new problems develop.The patient verbalized understanding.   Orders Placed This Encounter  Procedures   Basic Metabolic Panel   Amb Ref to Medical Weight Management     Requested Prescriptions   Signed Prescriptions Disp Refills   metoprolol succinate (TOPROL-XL) 100 MG 24 hr tablet 135 tablet 0    Sig: Take 1.5 tablets (150 mg total) by mouth daily.   phentermine 15 MG capsule 30 capsule 0    Sig: Take 1 capsule (15 mg total) by mouth every morning.    Return in about 3 months (around 01/16/2024) for Follow-Up or next available chronic conditions.  Senaida Dama, NP

## 2023-10-17 NOTE — Progress Notes (Deleted)
 Patient is here for weight management and hypertension

## 2023-10-17 NOTE — Progress Notes (Signed)
 Weight management and blood pressure follow up

## 2023-10-18 ENCOUNTER — Encounter (INDEPENDENT_AMBULATORY_CARE_PROVIDER_SITE_OTHER): Payer: Self-pay

## 2023-10-18 ENCOUNTER — Encounter: Payer: Self-pay | Admitting: Family

## 2023-10-18 LAB — BASIC METABOLIC PANEL WITH GFR
BUN/Creatinine Ratio: 11 (ref 9–23)
BUN: 8 mg/dL (ref 6–20)
CO2: 23 mmol/L (ref 20–29)
Calcium: 9.6 mg/dL (ref 8.7–10.2)
Chloride: 99 mmol/L (ref 96–106)
Creatinine, Ser: 0.75 mg/dL (ref 0.57–1.00)
Glucose: 80 mg/dL (ref 70–99)
Potassium: 4.3 mmol/L (ref 3.5–5.2)
Sodium: 140 mmol/L (ref 134–144)
eGFR: 111 mL/min/{1.73_m2} (ref >59)

## 2023-10-22 ENCOUNTER — Telehealth: Admitting: Physician Assistant

## 2023-10-22 ENCOUNTER — Encounter: Payer: Self-pay | Admitting: Emergency Medicine

## 2023-10-22 DIAGNOSIS — R3989 Other symptoms and signs involving the genitourinary system: Secondary | ICD-10-CM | POA: Diagnosis not present

## 2023-10-22 MED ORDER — NITROFURANTOIN MONOHYD MACRO 100 MG PO CAPS
100.0000 mg | ORAL_CAPSULE | Freq: Two times a day (BID) | ORAL | 0 refills | Status: DC
Start: 2023-10-22 — End: 2024-03-03

## 2023-10-22 NOTE — Progress Notes (Signed)

## 2023-10-26 ENCOUNTER — Ambulatory Visit: Admitting: Family

## 2023-11-02 ENCOUNTER — Telehealth: Admitting: Physician Assistant

## 2023-11-02 DIAGNOSIS — R197 Diarrhea, unspecified: Secondary | ICD-10-CM

## 2023-11-02 NOTE — Progress Notes (Signed)
  Because of having diarrhea after recent antibiotic use, I feel your condition warrants further evaluation and I recommend that you be seen in a face-to-face visit. There is an infection that can happen secondary to antibiotics called C.Diff and there is testing that can be done to rule this out.   NOTE: There will be NO CHARGE for this E-Visit   If you are having a true medical emergency, please call 911.     For an urgent face to face visit, [facility] has multiple urgent care centers for your convenience.  Click the link below for the full list of locations and hours, walk-in wait times, appointment scheduling options and driving directions:  Urgent Care - [place], [place], [place], [place], [place], [place]  [place]     Your MyChart E-visit questionnaire answers were reviewed by a board certified advanced clinical practitioner to complete your personal care plan based on your specific symptoms.    Thank you for using e-Visits.      I have spent 5 minutes in review of e-visit questionnaire, review and updating patient chart, medical decision making and response to patient.   Angelia Kelp, PA-C

## 2023-12-09 ENCOUNTER — Other Ambulatory Visit: Payer: Self-pay | Admitting: Nurse Practitioner

## 2023-12-09 DIAGNOSIS — D508 Other iron deficiency anemias: Secondary | ICD-10-CM

## 2023-12-09 NOTE — Progress Notes (Deleted)
 Patient Care Team: Senaida Dama, NP as PCP - General (Nurse Practitioner)  Clinic Day:  12/09/2023  Referring physician: Senaida Dama, NP  ASSESSMENT & PLAN:   Assessment & Plan: Anemia Going back to 2018, her hemoglobin has been persistently around 10.  In July 2024, her hemoglobin did drop to 8.0.  She was started on oral iron .  Takes this daily.  And recheck of her blood count showed improvement of hemoglobin to 10.4.  Iron  studies were not pursued.  She was referred to hematology.  The patient reports that her father and sister may also be anemic.  The patient denies any history of cancer or blood disorders.  She states she has never been able to donate blood due to anemia. She states that her menstrual periods last about 5 days.  She does have heavier menstrual flows on days 3 and 4 of her cycle.  She reports changing a tampon every 3-4 hours on those heavy days.  She denies presence of blood in her stool.  She denies epistaxis.  She denies hematemesis.  She denies unusual bruising or bleeding. Discussed with patient the likely causes of her iron  deficiency.  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  Will run a test called hemoglobin fractionation cascade in addition to blood count, iron  studies, and B12 level.  If this test for beta thalassemia is negative, we will run additional testing for alpha thalassemia.  We discussed the possibility of IV iron  treatments.  The patient is open and agreeable to this as a plan.  Will contact her with lab results and plan for further treatment as indicated.    The patient understands the plans discussed today and is in agreement with them.  She knows to contact our office if she develops concerns prior to her next appointment.  I provided *** minutes of face-to-face time during this encounter and > 50% was spent counseling as documented under my assessment and plan.    Sharyon Deis, NP  [facility] - A DEPT OF [facility] [address] [place] [address] Dept: [phone] Dept Fax: [phone]   No orders of the defined types were placed in this encounter.     CHIEF COMPLAINT:  CC: Iron  deficiency anemia  Current Treatment: Oral iron   INTERVAL HISTORY:  Aerianna is here today for repeat clinical assessment.  Initial visit with me on 06/12/2023.  Was doing well on oral iron  supplementation.  She denies fevers or chills. She denies pain. Her appetite is good. Her weight {Weight change:10426}.  I have reviewed the past medical history, past surgical history, social history and family history with the patient and they are unchanged from previous note.  ALLERGIES:  has no known allergies.  MEDICATIONS:  Current Outpatient Medications  Medication Sig Dispense Refill   cyclobenzaprine  (FLEXERIL ) 10 MG tablet Take 0.5-1 tablets (5-10 mg total) by mouth 3 (three) times daily as needed. (Patient not taking: Reported on 10/17/2023) 30 tablet 0   doxycycline  (VIBRAMYCIN ) 100 MG capsule Take 1 capsule (100 mg total) by mouth 2 (two) times daily. (Patient not taking: Reported on 10/17/2023) 20 capsule 0   fluticasone  (FLONASE ) 50 MCG/ACT nasal spray Place 2 sprays into both nostrils daily. (Patient not taking: Reported on 10/17/2023) 16 g 6   Iron , Ferrous Sulfate , 325 (65 Fe) MG TABS Take 325 mg by mouth daily. 90 tablet  0   metoprolol  succinate (TOPROL -XL) 100 MG 24 hr tablet Take 1.5 tablets (150 mg total) by mouth daily. 135 tablet 0   naproxen  (NAPROSYN ) 500 MG tablet Take 1 tablet (500 mg total) by mouth 2 (two) times daily with a meal. (Patient not taking: Reported on 10/17/2023) 30 tablet 0   nitrofurantoin , macrocrystal-monohydrate, (MACROBID ) 100 MG capsule Take 1 capsule (100 mg total) by mouth 2 (two) times daily. 10 capsule 0   ondansetron  (ZOFRAN -ODT) 4 MG disintegrating tablet Take 1-2 tablets (4-8 mg  total) by mouth every 8 (eight) hours as needed. (Patient not taking: Reported on 10/17/2023) 20 tablet 0   phentermine  15 MG capsule Take 1 capsule (15 mg total) by mouth every morning. 30 capsule 0   No current facility-administered medications for this visit.    HISTORY OF PRESENT ILLNESS:   Oncology History   No history exists.      REVIEW OF SYSTEMS:   Constitutional: Denies fevers, chills or abnormal weight loss Eyes: Denies blurriness of vision Ears, nose, mouth, throat, and face: Denies mucositis or sore throat Respiratory: Denies cough, dyspnea or wheezes Cardiovascular: Denies palpitation, chest discomfort or lower extremity swelling Gastrointestinal:  Denies nausea, heartburn or change in bowel habits Skin: Denies abnormal skin rashes Lymphatics: Denies new lymphadenopathy or easy bruising Neurological:Denies numbness, tingling or new weaknesses Behavioral/Psych: Mood is stable, no new changes  All other systems were reviewed with the patient and are negative.   VITALS:   There were no vitals taken for this visit.  Wt Readings from Last 3 Encounters:  10/17/23 (!) 344 lb 9.6 oz (156.3 kg)  08/13/23 (!) 347 lb 12.8 oz (157.8 kg)  06/11/23 (!) 339 lb 3.2 oz (153.9 kg)    There is no height or weight on file to calculate BMI.  Performance status (ECOG): {CHL ONC [id number]  PHYSICAL EXAM:   GENERAL:alert, no distress and comfortable SKIN: skin color, texture, turgor are normal, no rashes or significant lesions EYES: normal, Conjunctiva are pink and non-injected, sclera clear OROPHARYNX:no exudate, no erythema and lips, buccal mucosa, and tongue normal  NECK: supple, thyroid normal size, non-tender, without nodularity LYMPH:  no palpable lymphadenopathy in the cervical, axillary or inguinal LUNGS: clear to auscultation and percussion with normal breathing effort HEART: regular rate & rhythm and no murmurs and no lower extremity edema ABDOMEN:abdomen soft,  non-tender and normal bowel sounds Musculoskeletal:no cyanosis of digits and no clubbing  NEURO: alert & oriented x 3 with fluent speech, no focal motor/sensory deficits  LABORATORY DATA:  I have reviewed the data as listed    Component Value Date/Time   NA 140 10/17/2023 1506   K 4.3 10/17/2023 1506   CL 99 10/17/2023 1506   CO2 23 10/17/2023 1506   GLUCOSE 80 10/17/2023 1506   GLUCOSE 117 (H) 08/08/2016 1155   BUN 8 10/17/2023 1506   CREATININE 0.75 10/17/2023 1506   CREATININE 0.64 06/01/2015 1022   CALCIUM 9.6 10/17/2023 1506   PROT 7.1 01/01/2023 0000   ALBUMIN 4.0 01/01/2023 0000   AST 9 01/01/2023 0000   ALT 8 01/01/2023 0000   ALKPHOS 84 01/01/2023 0000   BILITOT <0.2 01/01/2023 0000   GFRNONAA >60 08/08/2016 1155   GFRNONAA >89 06/01/2015 1022   GFRAA >60 08/08/2016 1155   GFRAA >89 06/01/2015 1022    No results found for: "SPEP", "UPEP"  Lab Results  Component Value Date   WBC 10.8 (H) 06/11/2023   NEUTROABS 5.6 06/11/2023   HGB 10.8 (  L) 06/11/2023   HCT 33.5 (L) 06/11/2023   MCV 60.0 (L) 06/11/2023   PLT 344 06/11/2023      Chemistry      Component Value Date/Time   NA 140 10/17/2023 1506   K 4.3 10/17/2023 1506   CL 99 10/17/2023 1506   CO2 23 10/17/2023 1506   BUN 8 10/17/2023 1506   CREATININE 0.75 10/17/2023 1506   CREATININE 0.64 06/01/2015 1022      Component Value Date/Time   CALCIUM 9.6 10/17/2023 1506   ALKPHOS 84 01/01/2023 0000   AST 9 01/01/2023 0000   ALT 8 01/01/2023 0000   BILITOT <0.2 01/01/2023 0000       RADIOGRAPHIC STUDIES: I have personally reviewed the radiological images as listed and agreed with the findings in the report. No results found.

## 2023-12-09 NOTE — Assessment & Plan Note (Deleted)
 Going back to 2018, her hemoglobin has been persistently around 10.  In July 2024, her hemoglobin did drop to 8.0.  She was started on oral iron.  Takes this daily.  And recheck of her blood count showed improvement of hemoglobin to 10.4.  Iron studies were not pursued.  She was referred to hematology.  The patient reports that her father and sister may also be anemic.  The patient denies any history of cancer or blood disorders.  She states she has never been able to donate blood due to anemia. She states that her menstrual periods last about 5 days.  She does have heavier menstrual flows on days 3 and 4 of her cycle.  She reports changing a tampon every 3-4 hours on those heavy days.  She denies presence of blood in her stool.  She denies epistaxis.  She denies hematemesis.  She denies unusual bruising or bleeding. Discussed with patient the likely causes of her iron deficiency.  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  Will run a test called hemoglobin fractionation cascade in addition to blood count, iron studies, and B12 level.  If this test for beta thalassemia is negative, we will run additional testing for alpha thalassemia.  We discussed the possibility of IV iron treatments.  The patient is open and agreeable to this as a plan.  Will contact her with lab results and plan for further treatment as indicated.

## 2023-12-10 ENCOUNTER — Inpatient Hospital Stay: Payer: Commercial Managed Care - PPO | Admitting: Nurse Practitioner

## 2023-12-10 ENCOUNTER — Inpatient Hospital Stay: Payer: Commercial Managed Care - PPO | Attending: Family

## 2023-12-10 ENCOUNTER — Telehealth: Payer: Self-pay

## 2023-12-10 DIAGNOSIS — D649 Anemia, unspecified: Secondary | ICD-10-CM

## 2023-12-10 NOTE — Telephone Encounter (Signed)
 Called patient due to not showing up for her 1300 lab app and 1330 app with Rande Bushy NP. Left a message on her voicemail that she will be rescheduled and todays app will be no show.

## 2024-01-16 ENCOUNTER — Ambulatory Visit: Admitting: Family

## 2024-02-25 ENCOUNTER — Encounter: Payer: Self-pay | Admitting: Family Medicine

## 2024-02-25 ENCOUNTER — Telehealth: Admitting: Family Medicine

## 2024-02-25 DIAGNOSIS — M545 Low back pain, unspecified: Secondary | ICD-10-CM

## 2024-02-25 MED ORDER — NAPROXEN 500 MG PO TABS: 500.0000 mg | ORAL_TABLET | Freq: Two times a day (BID) | ORAL | 0 refills | Status: DC

## 2024-02-25 MED ORDER — CYCLOBENZAPRINE HCL 10 MG PO TABS
10.0000 mg | ORAL_TABLET | Freq: Three times a day (TID) | ORAL | 0 refills | Status: AC | PRN
Start: 2024-02-25 — End: ?

## 2024-02-25 NOTE — Progress Notes (Signed)
E-Visit for Back Pain   We are sorry that you are not feeling well.  Here is how we plan to help!  Based on what you have shared with me it looks like you mostly have acute back pain.  Acute back pain is defined as musculoskeletal pain that can resolve in 1-3 weeks with conservative treatment.  I have prescribed Naprosyn 500 mg take one by mouth twice a day non-steroid anti-inflammatory (NSAID) as well as Flexeril 10 mg every eight hours as needed which is a muscle relaxer  Some patients experience stomach irritation or in increased heartburn with anti-inflammatory drugs.  Please keep in mind that muscle relaxer's can cause fatigue and should not be taken while at work or driving.  Back pain is very common.  The pain often gets better over time.  The cause of back pain is usually not dangerous.  Most people can learn to manage their back pain on their own.  Home Care Stay active.  Start with short walks on flat ground if you can.  Try to walk farther each day. Do not sit, drive or stand in one place for more than 30 minutes.  Do not stay in bed. Do not avoid exercise or work.  Activity can help your back heal faster. Be careful when you bend or lift an object.  Bend at your knees, keep the object close to you, and do not twist. Sleep on a firm mattress.  Lie on your side, and bend your knees.  If you lie on your back, put a pillow under your knees. Only take medicines as told by your doctor. Put ice on the injured area. Put ice in a plastic bag Place a towel between your skin and the bag Leave the ice on for 15-20 minutes, 3-4 times a day for the first 2-3 days. 210 After that, you can switch between ice and heat packs. Ask your doctor about back exercises or massage. Avoid feeling anxious or stressed.  Find good ways to deal with stress, such as exercise.  Get Help Right Way If: Your pain does not go away with rest or medicine. Your pain does not go away in 1 week. You have new  problems. You do not feel well. The pain spreads into your legs. You cannot control when you poop (bowel movement) or pee (urinate) You feel sick to your stomach (nauseous) or throw up (vomit) You have belly (abdominal) pain. You feel like you may pass out (faint). If you develop a fever.  Make Sure you: Understand these instructions. Will watch your condition Will get help right away if you are not doing well or get worse.  Your e-visit answers were reviewed by a board certified advanced clinical practitioner to complete your personal care plan.  Depending on the condition, your plan could have included both over the counter or prescription medications.  If there is a problem please reply  once you have received a response from your provider.  Your safety is important to [place].  If you have drug allergies check your prescription carefully.    You can use MyChart to ask questions about today's visit, request a non-urgent call back, or ask for a work or school excuse for 24 hours related to this e-Visit. If it has been greater than 24 hours you will need to follow up with your provider, or enter a new e-Visit to address those concerns.  You will get an e-mail in the next two days asking about  your experience.  I hope that your e-visit has been valuable and will speed your recovery. Thank you for using e-visits.  I provided 5 minutes of non face-to-face time during this encounter for chart review, medication and order placement, as well as and documentation.

## 2024-03-03 ENCOUNTER — Telehealth: Admitting: Physician Assistant

## 2024-03-03 DIAGNOSIS — J069 Acute upper respiratory infection, unspecified: Secondary | ICD-10-CM

## 2024-03-03 MED ORDER — NAPROXEN 500 MG PO TABS: 500.0000 mg | ORAL_TABLET | Freq: Two times a day (BID) | ORAL | 0 refills | Status: AC

## 2024-03-03 MED ORDER — BENZONATATE 100 MG PO CAPS
100.0000 mg | ORAL_CAPSULE | Freq: Three times a day (TID) | ORAL | 0 refills | Status: AC | PRN
Start: 2024-03-03 — End: ?

## 2024-03-03 MED ORDER — ONDANSETRON 4 MG PO TBDP
4.0000 mg | ORAL_TABLET | Freq: Three times a day (TID) | ORAL | 0 refills | Status: AC | PRN
Start: 2024-03-03 — End: ?

## 2024-03-03 MED ORDER — FLUTICASONE PROPIONATE 50 MCG/ACT NA SUSP: 2.0000 | Freq: Every day | NASAL | 0 refills | Status: DC

## 2024-03-03 NOTE — Progress Notes (Signed)
 E-Visit for Covid + Flu Screening  Your current symptoms could be consistent with Covid or Flu.  Please complete a Covid +Flu test either at home or check with your local pharmacy to see if they provide testing.    If you have tested positive for COVID-19 or Flu, meaning that you were infected with the virus for which you tested positive for and could give the virus to others.  Most people with COVID-19 or Flu have mild illness and can recover at home without medical care. Do not leave your home, except to get medical care. Do not visit public areas and do not go to places where you are unable to wear a mask. It is important that you stay home  to take care for yourself and to help protect other people in your home and community.      Isolation Instructions:   You are to isolate at home until you have been fever free for at least 24 hours without a fever-reducing medication, and symptoms have been steadily improving for 24 hours. At that time,  you can end isolation but need to mask for an additional 5 days.  If you must be around other household members who do not have symptoms, you need to make sure that both you and the family members are masking consistently with a high-quality mask.  If you note any worsening of symptoms despite treatment, please seek an in-person evaluation ASAP. If you note any significant shortness of breath or any chest pain, please seek ER evaluation. Please do not delay care!  Go to the nearest hospital ED for assessment if fever/cough/breathlessness are severe or illness seems like a threat to life.    The following symptoms may appear 2-14 days after exposure: Fever Cough Shortness of breath or difficulty breathing Chills Repeated shaking with chills Muscle pain Headache Sore throat New loss of taste or smell Fatigue Congestion or runny nose Nausea or vomiting Diarrhea  You can use medication such as I have prescribed Tessalon  Perles 100 mg. You may take  1-2 capsules every 8 hours as needed for cough, I have prescribed an anti-inflammatory - Naprosyn  500 mg. Take twice daily as needed for fever or body aches for 2 weeks, I have prescribed Fluticasone  nasal spray 2 sprays in each nostril one time per dayasal spray 2 sprays in each nostril one time per day, and I have prescribed Zofran  4 mg tablets 1 every 6 hours if needed for nausea  You may also take acetaminophen  (Tylenol ) as needed for fever.  HOME CARE Only take medications as instructed by your medical team. Drink plenty of fluids and get plenty of rest. A steam or ultrasonic humidifier can help if you have congestion.  GET HELP RIGHT AWAY IF YOU HAVE EMERGENCY WARNING SIGNS.  Call 911 or proceed to your closest emergency facility if: You develop worsening high fever. Trouble breathing Bluish lips or face Persistent pain or pressure in the chest New confusion Inability to wake or stay awake You cough up blood. Your symptoms become more severe Inability to hold down food or fluids  This list is not all possible symptoms. Contact your medical provider for any symptoms that are severe or concerning to you.   Your e-visit answers were reviewed by a board certified advanced clinical practitioner to complete your personal care plan.  Depending on the condition, your plan could have included both over the counter or prescription medications.  If there is a problem, please reply once  you have received a response from your provider.  Your safety is important to us .  If you have drug allergies check your prescription carefully.    You can use MyChart to ask questions about today's visit, request a non-urgent call back, or ask for a work or school excuse for 24 hours related to this e-Visit. If it has been greater than 24 hours you will need to follow up with your provider or enter a new e-Visit to address those concerns. You will get an e-mail in the next two days asking about your experience.   I hope that your e-visit has been valuable and will speed your recovery. Thank you for using e-visits.       I have spent 5 minutes in review of e-visit questionnaire, review and updating patient chart, medical decision making and response to patient.   Delon CHRISTELLA Dickinson, PA-C

## 2024-03-31 ENCOUNTER — Telehealth: Admitting: Family Medicine

## 2024-03-31 ENCOUNTER — Encounter: Payer: Self-pay | Admitting: Family Medicine

## 2024-03-31 DIAGNOSIS — H6991 Unspecified Eustachian tube disorder, right ear: Secondary | ICD-10-CM

## 2024-03-31 MED ORDER — FLUTICASONE PROPIONATE 50 MCG/ACT NA SUSP
2.0000 | Freq: Every day | NASAL | 0 refills | Status: AC
Start: 2024-03-31 — End: ?

## 2024-03-31 NOTE — Progress Notes (Signed)
E-Visit for Ear Pain - Eustachian Tube Dysfunction   We are sorry that you are not feeling well. Here is how we plan to help!  Based on what you have shared with me it looks like you have Eustachian Tube Dysfunction.  Eustachian Tube Dysfunction is a condition where the tubes that connect your middle ears to your upper throat become blocked. This can lead to discomfort, hearing difficulties and a feeling of fullness in your ear. Eustachian tube dysfunction usually resolves itself in a few days. The usual symptoms include: Hearing problems Tinnitus, or ringing in your ears Clicking or popping sounds A feeling of fullness in your ears Pain that mimics an ear infection Dizziness, vertigo or balance problems A "tickling" sensation in your ears  ?Eustachian tube dysfunction symptoms may get worse in higher altitudes. This is called barotrauma, and it can happen while scuba diving, flying in an airplane or driving in the mountains.   What causes eustachian tube dysfunction? Allergies and infections (like the common cold and the flu) are the most common causes of eustachian tube dysfunction. These conditions can cause inflammation and mucus buildup, leading to blockage. GERD, or chronic acid reflux, can also cause ETD. This is because stomach acid can back up into your throat and result in inflammation. As mentioned above, altitude changes can also cause ETD.   What are some common eustachian tube dysfunction treatments? In most cases, treatment isn't necessary because ETD often resolves on its own. However, you might need treatment if your symptoms linger for more than two weeks.    Eustachian tube dysfunction treatment depends on the cause and the severity of your condition. Treatments may include home remedies, medications or, in severe cases, surgery.     HOME CARE: Sometimes simple home remedies can help with mild cases of eustachian tube dysfunction. To try and clear the blockage, you  can: Chew gum. Yawn. Swallow. Try the Valsalva maneuver (breathing out forcefully while closing your mouth and pinching your nostrils). Use a saline spray to clear out nasal passages.  MEDICATIONS: Over-the-counter medications can help if allergies are causing eustachian tube dysfunction. Try antihistamines (like cetirizine or diphenhydramine) to ease your symptoms. If you have discomfort, pain relievers -- such as acetaminophen or ibuprofen -- can help.  Sometimes intranasal glucocorticosteroids (like Flonase or Nasacort) help.  I have prescribed Fluticasone 50 mcg/spray 2 sprays in each nostril daily for 10-14 days    GET HELP RIGHT AWAY IF: Fever is over 102.2 degrees. You develop progressive ear pain or hearing loss. Ear symptoms persist longer than 3 days after treatment.  MAKE SURE YOU: Understand these instructions. Will watch your condition. Will get help right away if you are not doing well or get worse.  Thank you for choosing an e-visit.  Your e-visit answers were reviewed by a board certified advanced clinical practitioner to complete your personal care plan. Depending upon the condition, your plan could have included both over the counter or prescription medications.  Please review your pharmacy choice. Make sure the pharmacy is open so you can pick up the prescription now. If there is a problem, you may contact your provider through MyChart messaging and have the prescription routed to another pharmacy.  Your safety is important to us. If you have drug allergies check your prescription carefully.   For the next 24 hours you can use MyChart to ask questions about today's visit, request a non-urgent call back, or ask for a work or school excuse. You will   get an email with a survey after your eVisit asking about your experience. We would appreciate your feedback. I hope that your e-visit has been valuable and will aid in your recovery.     I provided 5 minutes of non  face-to-face time during this encounter for chart review, medication and order placement, as well as and documentation.   

## 2024-04-21 ENCOUNTER — Telehealth: Admitting: Physician Assistant

## 2024-04-21 DIAGNOSIS — R197 Diarrhea, unspecified: Secondary | ICD-10-CM | POA: Diagnosis not present

## 2024-04-21 NOTE — Progress Notes (Signed)
 We are sorry that you are not feeling well.  Here is how we plan to help!  Based on what you have shared with me it looks like you have Acute Infectious Diarrhea.  Most cases of acute diarrhea are due to infections with virus and bacteria and are self-limited conditions lasting less than 14 days.  For your symptoms you may take Imodium 2 mg tablets that are over the counter at your local pharmacy. Take two tablet now and then one after each loose stool up to 6 a day.   Antibiotics are not needed for most people with diarrhea.  HOME CARE We recommend changing your diet to help with your symptoms for the next few days. Drink plenty of fluids that contain water salt and sugar. Sports drinks such as Gatorade may help.  You may try broths, soups, bananas, applesauce, soft breads, mashed potatoes or crackers.  You are considered infectious for as long as the diarrhea continues. Hand washing or use of alcohol  based hand sanitizers is recommend. It is best to stay out of work or school until your symptoms stop.   GET HELP RIGHT AWAY If you have dark yellow colored urine or do not pass urine frequently you should drink more fluids.   If your symptoms worsen  If you feel like you are going to pass out (faint) You have a new problem  MAKE SURE YOU  Understand these instructions. Will watch your condition. Will get help right away if you are not doing well or get worse.  Your e-visit answers were reviewed by a board certified advanced clinical practitioner to complete your personal care plan.  Depending on the condition, your plan could have included both over the counter or prescription medications.  If there is a problem please reply  once you have received a response from your provider.  Your safety is important to us .  If you have drug allergies check your prescription carefully.    You can use MyChart to ask questions about today's visit, request a non-urgent call back, or ask for a work  or school excuse for 24 hours related to this e-Visit. If it has been greater than 24 hours you will need to follow up with your provider, or enter a new e-Visit to address those concerns.   You will get an e-mail in the next two days asking about your experience.  I hope that your e-visit has been valuable and will speed your recovery. Thank you for using e-visits.   I have spent 5 minutes in review of e-visit questionnaire, review and updating patient chart, medical decision making and response to patient.   Delon CHRISTELLA Dickinson, PA-C

## 2024-05-12 ENCOUNTER — Encounter: Payer: Self-pay | Admitting: Family

## 2024-05-12 NOTE — Progress Notes (Signed)
 Erroneous encounter-disregard

## 2024-06-03 ENCOUNTER — Telehealth: Admitting: Family Medicine

## 2024-06-03 DIAGNOSIS — J069 Acute upper respiratory infection, unspecified: Secondary | ICD-10-CM

## 2024-06-03 MED ORDER — IPRATROPIUM BROMIDE 0.03 % NA SOLN: 2.0000 | Freq: Two times a day (BID) | NASAL | 0 refills | Status: AC

## 2024-06-03 NOTE — Progress Notes (Signed)
